# Patient Record
Sex: Male | Born: 1980
Health system: Southern US, Community
[De-identification: ages and names within clinical notes are randomized; demographics above are authoritative.]

## PROBLEM LIST (undated history)

## (undated) DIAGNOSIS — F32A Depression, unspecified: Secondary | ICD-10-CM

## (undated) DIAGNOSIS — G4730 Sleep apnea, unspecified: Secondary | ICD-10-CM

## (undated) DIAGNOSIS — G4700 Insomnia, unspecified: Secondary | ICD-10-CM

## (undated) DIAGNOSIS — K8020 Calculus of gallbladder without cholecystitis without obstruction: Secondary | ICD-10-CM

## (undated) DIAGNOSIS — F329 Major depressive disorder, single episode, unspecified: Secondary | ICD-10-CM

## (undated) DIAGNOSIS — B009 Herpesviral infection, unspecified: Secondary | ICD-10-CM

## (undated) DIAGNOSIS — F419 Anxiety disorder, unspecified: Secondary | ICD-10-CM

## (undated) HISTORY — DX: Herpesviral infection, unspecified: B00.9

## (undated) HISTORY — DX: Insomnia, unspecified: G47.00

## (undated) HISTORY — DX: Sleep apnea, unspecified: G47.30

## (undated) HISTORY — DX: Anxiety disorder, unspecified: F41.9

## (undated) HISTORY — DX: Major depressive disorder, single episode, unspecified: F32.9

## (undated) HISTORY — DX: Depression, unspecified: F32.A

---
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
## 2008-08-29 ENCOUNTER — Emergency Department (HOSPITAL_COMMUNITY): Admission: EM | Admit: 2008-08-29 | Discharge: 2008-08-29 | Payer: Self-pay | Admitting: Family Medicine

## 2011-09-30 HISTORY — PX: BONE MARROW HARVEST: SHX896

## 2014-10-18 ENCOUNTER — Ambulatory Visit (INDEPENDENT_AMBULATORY_CARE_PROVIDER_SITE_OTHER): Payer: 59 | Admitting: Family Medicine

## 2014-10-18 ENCOUNTER — Encounter: Payer: Self-pay | Admitting: Family Medicine

## 2014-10-18 VITALS — BP 136/90 | HR 68 | Temp 98.6°F | Ht 68.0 in | Wt 237.4 lb

## 2014-10-18 DIAGNOSIS — Z13220 Encounter for screening for lipoid disorders: Secondary | ICD-10-CM

## 2014-10-18 DIAGNOSIS — B009 Herpesviral infection, unspecified: Secondary | ICD-10-CM

## 2014-10-18 DIAGNOSIS — G4700 Insomnia, unspecified: Secondary | ICD-10-CM | POA: Insufficient documentation

## 2014-10-18 DIAGNOSIS — J309 Allergic rhinitis, unspecified: Secondary | ICD-10-CM | POA: Insufficient documentation

## 2014-10-18 DIAGNOSIS — G4719 Other hypersomnia: Secondary | ICD-10-CM

## 2014-10-18 DIAGNOSIS — N529 Male erectile dysfunction, unspecified: Secondary | ICD-10-CM | POA: Insufficient documentation

## 2014-10-18 DIAGNOSIS — A6000 Herpesviral infection of urogenital system, unspecified: Secondary | ICD-10-CM | POA: Insufficient documentation

## 2014-10-18 DIAGNOSIS — F329 Major depressive disorder, single episode, unspecified: Secondary | ICD-10-CM

## 2014-10-18 DIAGNOSIS — G4726 Circadian rhythm sleep disorder, shift work type: Secondary | ICD-10-CM | POA: Insufficient documentation

## 2014-10-18 DIAGNOSIS — F418 Other specified anxiety disorders: Secondary | ICD-10-CM | POA: Insufficient documentation

## 2014-10-18 DIAGNOSIS — F32A Depression, unspecified: Secondary | ICD-10-CM

## 2014-10-18 DIAGNOSIS — R030 Elevated blood-pressure reading, without diagnosis of hypertension: Secondary | ICD-10-CM | POA: Insufficient documentation

## 2014-10-18 LAB — TSH: TSH: 1.102 u[IU]/mL (ref 0.350–4.500)

## 2014-10-18 LAB — BASIC METABOLIC PANEL
BUN: 13 mg/dL (ref 6–23)
CALCIUM: 9.4 mg/dL (ref 8.4–10.5)
CO2: 25 mEq/L (ref 19–32)
CREATININE: 1.03 mg/dL (ref 0.50–1.35)
Chloride: 106 mEq/L (ref 96–112)
Glucose, Bld: 96 mg/dL (ref 70–99)
Potassium: 4.2 mEq/L (ref 3.5–5.3)
SODIUM: 140 meq/L (ref 135–145)

## 2014-10-18 LAB — LIPID PANEL
CHOL/HDL RATIO: 4 ratio
CHOLESTEROL: 156 mg/dL (ref 0–200)
HDL: 39 mg/dL — ABNORMAL LOW (ref >39)
LDL Cholesterol: 96 mg/dL (ref 0–99)
Triglycerides: 105 mg/dL (ref <150)
VLDL: 21 mg/dL (ref 0–40)

## 2014-10-18 MED ORDER — VALACYCLOVIR HCL 1 G PO TABS: 1000.0000 mg | ORAL_TABLET | Freq: Every day | ORAL | Status: DC

## 2014-10-18 MED ORDER — FLUOXETINE HCL 20 MG PO TABS: 20.0000 mg | ORAL_TABLET | Freq: Every day | ORAL | Status: DC

## 2014-10-18 MED ORDER — BUPROPION HCL ER (XL) 300 MG PO TB24: 300.0000 mg | ORAL_TABLET | Freq: Every day | ORAL | Status: DC

## 2014-10-18 MED ORDER — TRAZODONE HCL 100 MG PO TABS
100.0000 mg | ORAL_TABLET | Freq: Every evening | ORAL | Status: DC | PRN
Start: 2014-10-18 — End: 2015-10-24

## 2014-10-18 NOTE — Assessment & Plan Note (Signed)
Chronic known insomnia, likely multifactorial etiology due to chronic anxiety / mood disorder, poor sleep hygiene, and currently 3rd shift work. Possible OSA - Previously improved on anti-depressants and Trazodone PRN  Plan: 1. Refilled Trazodone 100mg  qhs PRN insomnia 2. Refilled Buproprion-XL and Fluoxetine 3. Work-up for OSA with sleep study

## 2014-10-18 NOTE — Patient Instructions (Signed)
Dear Q, Thank you for coming in to clinic today. It was good to meet you!  Today ordered your medications, and labs (chemistry, cholesterol, TSH), also ordered sleep study. You will be called with this appointment.  Some important numbers from today's visit: BP - 137/90 Results -  Please schedule a follow-up appointment with Dr. Althea CharonKaramalegos for follow-up results and sleep study in 1-3 months.  If you have any other questions or concerns, please feel free to call the clinic to contact me. You may also schedule an earlier appointment if necessary.  However, if your symptoms get significantly worse, please go to the Emergency Department to seek immediate medical attention.  Saralyn PilarAlexander Karamalegos, DO [facility]

## 2014-10-18 NOTE — Assessment & Plan Note (Signed)
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 Refilled Valacyclovir 1g daily suppression

## 2014-10-18 NOTE — Assessment & Plan Note (Signed)
Elevated BP, borderline HTN. No known h/o HTN  Plan: 1. Re-check BP at next follow-up 2. Check BMET

## 2014-10-18 NOTE — Progress Notes (Signed)
Subjective:    Patient ID: Jesse Shelton, male    DOB: [birth year], 34 y.o.   MRN: [id number]  Patient presents to establish as new patient to [facility]. Previously followed by physician at [facility], last seen < 1 year ago, and currently out of all rx medications for past few months.  HPI  ANXIETY / DEPRESSION / H/o ADHD: - Chronic history of anxiety with mood disorder diagnosed in 2007 by primary care doctor, referred to therapist for counseling in 2008-2009 with improvement, started on anti-depressant medications with Buproprion (also for smoking cessation), added Fluoxetine, overall with improved mood, admitted side-effect of decreased sexual drive and given Viagra - Currently has run out of all psych meds (Buprioprion, Fluoxetine, Trazodone) for past 3-4 months, last seen by prior doctor about 1 year ago, and now following up today to get re-established and renew rx's. Admits anxiety seems to be worse off of medications, otherwise admits to stable mood. Some increased difficulty sleeping (see insomnia) - Admits family hx mood disorder (Grandmother, Aunt) - PHQ-2: 0 (negative) - Admits fatigue / sleepiness - Denies suicidal or homicidal ideation, feelings of sadness or guilt, decreased energy, palpitations, chest pain, SOB    SLEEPINESS / FATIGUE / INSOMNIA: - Chronic h/o insomnia thought to be related to anxiety / mood disorder dx for past 6-7 years - Currently working 3rd shift ([employer]), which has worsened his insomnia, previously better when on normal sleep schedule, now takes multiple consecutive "naps". Admits to difficulty staying asleep but able to initially fall asleep. Previously improved on Trazodone PRN (not every night) - Additionally, complains of increased sleepiness (unclear duration) with some fatigue, but mostly feels "very tired" sleep is not always restful, occasionally falls asleep easily during day, but denies falling asleep during work, while driving, or during  any significant stimulating activities. - Admits bed partner describes episodes of snoring and "stopped breathing"  GENITAL HERPES: - Diagnosed in 2009, initially treated with Acyclovir, eventually switched to Valacyclovir daily for suppression on dose 1g daily. Infrequent outbreaks, currently improved. Had been getting rx from [facility] and [facility] (when no longer followed at 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 - Lives at home in [place] with 2 children (Daughter-6 yr, Son-4 yr), currently in relationship with children's Mother but she lives in separate location  - Active smoker mostly e-cigs 2x daily >10 years (not heavy, mostly for stress-reducing), rarely smokes actual cigs - black & mild (1x monthly) - Drinks alcohol, 1x weekly (1-2 cans beer, 1 liquor drink, usually weekends)  Review of Systems  See above HPI    Objective:   Physical Exam  BP 136/90 mmHg  Pulse 68  Temp(Src) 98.6 F (37 C) (Oral)  Ht  (1.727 m)  Wt 237 lb 6.4 oz (107.684 kg)  BMI 36.10 kg/m2  Gen - well-appearing, NAD HEENT - NCAT, PERRL, patent nares w/o congestion, oropharynx clear, Mallampati II-III, MMM Neck - supple, non-tender, no LAD, no thyromegaly Heart - RRR, no murmurs heard Lungs - CTAB. Normal work of breathing. Abd - soft, NTND. Scar s/p ventral hernia repair (midline above umbilicus) Ext - non-tender, no edema, peripheral pulses intact +2 b/l dp Skin - warm, dry, no  rashes Neuro - awake, alert, grossly non-focal Psych - well-groomed, congruent mood, normal speech / thought content, good insight     Assessment & Plan:   See specific A&P  problem list for details.

## 2014-10-18 NOTE — Assessment & Plan Note (Signed)
Chronic hx since 2007, currently stable without recent flare despite off medications x 3-4 months, some worsening anxiety, associated insomnia / fatigue - Previously followed by therapist 2008-2009 - PHQ-2 (0, negative)  Plan: 1. Resumed Buproprion-XL 300mg  daily 2. Resumed Fluoxetine 20mg  daily 3. Refilled Trazodone PRN 4. RTC 1 month follow-up, consider future referral for therapy if needed

## 2014-10-18 NOTE — Assessment & Plan Note (Addendum)
History concerning for possible OSA (cannot rule out) with excessive daytime sleepiness and reported partner witnessed apnea events + snoring, exam supportive with neck appearing larger for body habitus and Mallampati II-III. Otherwise, etiology is likely multifactorial with differential including 3rd shift work, chronic insomnia likely due to anxiety/mood disorder, poor sleep hyigene - No red flag daytime sleeping events impairing function or causing injury  Plan: 1. Ordered nocturnal polysomnography (to [facility]), eval for possible OSA 2. Check BMET, TSH (alternative etiology of fatigue/sleepiness) 3. RTC following sleep study, review results, if negative re-emphasize sleep hygiene and improving modifiable factors

## 2014-10-24 ENCOUNTER — Encounter: Payer: Self-pay | Admitting: Family Medicine

## 2014-11-30 ENCOUNTER — Ambulatory Visit (HOSPITAL_BASED_OUTPATIENT_CLINIC_OR_DEPARTMENT_OTHER): Payer: 59 | Attending: Family Medicine | Admitting: Radiology

## 2014-11-30 ENCOUNTER — Ambulatory Visit (INDEPENDENT_AMBULATORY_CARE_PROVIDER_SITE_OTHER): Payer: 59 | Admitting: Pulmonary Disease

## 2014-11-30 ENCOUNTER — Encounter: Payer: Self-pay | Admitting: Pulmonary Disease

## 2014-11-30 VITALS — Ht 69.0 in | Wt 230.0 lb

## 2014-11-30 VITALS — BP 148/100 | HR 103 | Temp 98.0°F | Ht 70.0 in | Wt 231.0 lb

## 2014-11-30 DIAGNOSIS — R0683 Snoring: Secondary | ICD-10-CM

## 2014-11-30 DIAGNOSIS — G4733 Obstructive sleep apnea (adult) (pediatric): Secondary | ICD-10-CM | POA: Diagnosis not present

## 2014-11-30 DIAGNOSIS — G4763 Sleep related bruxism: Secondary | ICD-10-CM | POA: Diagnosis not present

## 2014-11-30 DIAGNOSIS — G4719 Other hypersomnia: Secondary | ICD-10-CM

## 2014-11-30 NOTE — Progress Notes (Signed)
Chief Complaint  Patient presents with  . Advice Only    Pt reports snoring, frequent awakening, and was told that he stops breathing while sleeping.     History of Present Illness: Jesse Shelton is a 34 y.o. male for evaluation of sleep problems.  His family has been concerned about his breathing while he is asleep.  He will snore and stop breathing while asleep.  This happens more on his back.  He will wake up hearing himself snore.  This has been getting worse.  He will also sometimes talk in his sleep.  He works as a Water quality scientist at [employer].  He works evening shift on Friday, Saturday, Sunday.  He usually goes to sleep at 7 am, and will sleep for about 4 to 6 hours.  He will get his kids from school and spend the afternoon with them.  He will then go back to sleep at night and sleep for another 6 hours.  In spite of sleeping this much he is tired and sleepy all the time.  He can fall asleep easily when sitting quiet.  He has trazodone, but only uses this when he doesn't have to watch his kids >> otherwise he sleeps too hard when he takes trazodone.  He will use energy drinks to stay awake when he is at work.  He denies sleep walking, bruxism, or nightmares.  There is no history of restless legs.  He denies sleep hallucinations, sleep paralysis, or cataplexy.  The Epworth score is 14 out of 24.  Jesse Shelton  has a past medical history of Anxiety; Depression; and Insomnia.  Jesse Shelton  has past surgical history that includes Hernia repair (N/A, 2002) and Elbow surgery (1996).  Prior to Admission medications   Medication Sig Start Date End Date Taking? Authorizing Provider  buPROPion (WELLBUTRIN XL) 300 MG 24 hr tablet Take 1 tablet (300 mg total) by mouth daily. 10/18/14  Yes Alexander Althea Charon, DO  FLUoxetine (PROZAC) 20 MG tablet Take 1 tablet (20 mg total) by mouth daily. 10/18/14  Yes Saralyn Pilar, DO  sildenafil (VIAGRA) 50 MG tablet Take by mouth. 11/19/12   Yes Historical Provider, MD  traZODone (DESYREL) 100 MG tablet Take 1 tablet (100 mg total) by mouth at bedtime as needed for sleep. 10/18/14  Yes Alexander Althea Charon, DO  valACYclovir (VALTREX) 1000 MG tablet Take 1 tablet (1,000 mg total) by mouth daily. 10/18/14  Yes Saralyn Pilar, DO    Allergies  Allergen Reactions  . Penicillins Other (See Comments)    Unknown reaction as child    His family history includes Cancer in his maternal grandmother; Depression in his maternal aunt and maternal grandmother.  He  reports that he has been smoking Cigarettes.  He has a .5 pack-year smoking history. He has never used smokeless tobacco. He reports that he drinks about 1.2 oz of alcohol per week. He reports that he does not use illicit drugs.   Physical Exam: Blood pressure 148/100, pulse 103, temperature 98 F (36.7 C), temperature source Oral, height  (1.778 m), weight 231 lb (104.781 kg), SpO2 96 %. Body mass index is 33.15 kg/(m^2).  General - No distress ENT - No sinus tenderness, no oral exudate, no LAN, no thyromegaly, TM clear, pupils equal/reactive, MP 3, 2+ tonsils, elongated uvula Cardiac - s1s2 regular, no murmur, pulses symmetric Chest - No wheeze/rales/dullness, good air entry, normal respiratory excursion Back - No focal tenderness Abd - Soft, non-tender, no organomegaly, + bowel  sounds Ext - No edema Neuro - Normal strength, cranial nerves intact Skin - No rashes Psych - Normal mood, and behavior  Discussion: He has snoring, sleep disruption, witnessed apnea, and daytime sleepiness.  He has history of depression and elevated blood pressure.  I am concerned he could have sleep apnea.  We discussed how sleep apnea can affect various health problems including risks for hypertension, cardiovascular disease, and diabetes.  We also discussed how sleep disruption can increase risks for accident, such as while driving.  Weight loss as a means of improving sleep apnea  was also reviewed.  Additional treatment options discussed were CPAP therapy, oral appliance, and surgical intervention.  Assessment/plan:  Snoring. Plan: - he has in lab sleep study scheduled for April 2016 >> will see if this can scheduled sooner  Shift work syndrome. Plan: - reviewed sleep hygiene - advised to try consolidating his sleep - will re-address this after review of his sleep study   Coralyn HellingVineet Quintus Premo, M.D. Pager [phone]

## 2014-11-30 NOTE — Patient Instructions (Signed)
Will see if we can schedule your sleep study to be done sooner than April Will call to schedule follow up after sleep study reviewed

## 2014-12-03 DIAGNOSIS — G4719 Other hypersomnia: Secondary | ICD-10-CM

## 2014-12-03 NOTE — Sleep Study (Signed)
   NAME: Charlott HollerQuentin D Walczyk DATE OF BIRTH:  1980-11-09 MEDICAL RECORD NUMBER [id number]  LOCATION: [facility]  PHYSICIAN: Jody Aguinaga D  DATE OF STUDY: 11/30/2014  SLEEP STUDY TYPE: Nocturnal Polysomnogram               REFERRING PHYSICIAN: Barbaraann BarthelBreen, James O, MD  INDICATION FOR STUDY: Hypersomnia with sleep apnea  EPWORTH SLEEPINESS SCORE:   10/24 HEIGHT: 5\' 9"  (175.3 cm)  WEIGHT: 104.327 kg (230 lb)    Body mass index is 33.95 kg/(m^2).  NECK SIZE: 17.5 in.  MEDICATIONS: Charted for review  SLEEP ARCHITECTURE: Total sleep time 290 minutes with sleep efficiency 72.5%. Stage I was 20.3%, stage II 51.4%, stage III 0.2%, REM 28.1% of total sleep time. Sleep latency 18.5 minutes, REM latency 197 minutes, awake after sleep onset 91.5 minutes, arousal index 28.1, bedtime medication: None  RESPIRATORY DATA: Apnea hypopnea index (AHI) 19.9 per hour. 96 total events scored, all as hypopneas, non-positional. REM AHI 0.7 per hour. This study was ordered as a diagnostic polysomnogram protocol without CPAP.  OXYGEN DATA: Moderate snoring with oxygen desaturation to a nadir of 84% and mean saturation 95.9% on room air  CARDIAC DATA: Sinus rhythm with occasional PVC  MOVEMENT/PARASOMNIA: No significant limb movement disturbance, bathroom 1. Frequent bruxism noted  IMPRESSION/ RECOMMENDATION:   1) Moderate obstructive sleep apnea/hypopnea syndrome, AHI 19.9 per hour with non-positional events. REM AHI 0.7 per hour. Moderate snoring with oxygen desaturation to a nadir of 84% and mean saturation 95.9% on room air. 2) This study was ordered as a diagnostic polysomnogram protocol without CPAP titration. This patient can return for dedicated CPAP titration study if appropriate. 3) Frequent bruxism was noted 4) Sleep pattern was marked by frequent brief awakenings throughout the study. Not all of this could be related to his respiratory pattern. The record indicates that he works a  third shift job which may expose him to shift worker sleep disorder related to disruption of circadian rhythm.   Waymon BudgeYOUNG,Bailyn Spackman D Diplomate, American Board of Sleep Medicine  ELECTRONICALLY SIGNED ON:  12/03/2014, 4:27 PM [facility] PH: [phone]   FX: [phone] ACCREDITED BY THE AMERICAN ACADEMY OF SLEEP MEDICINE

## 2014-12-31 ENCOUNTER — Encounter: Payer: Self-pay | Admitting: Pulmonary Disease

## 2014-12-31 DIAGNOSIS — G4733 Obstructive sleep apnea (adult) (pediatric): Secondary | ICD-10-CM

## 2015-01-01 ENCOUNTER — Encounter (HOSPITAL_BASED_OUTPATIENT_CLINIC_OR_DEPARTMENT_OTHER): Payer: 59

## 2015-01-01 NOTE — Telephone Encounter (Signed)
Message     I wanted to see if you received the results from the sleep study. My girlfriend has notice my sleep is becoming worst. Thank you.     Sleep study results are in the computer but no interpretation yet from [facility] Email response sent to patient notifying him we will check on this and to see how his girlfriend feels that his sleep is worsening Will forward to [facility] about the results/recs -- Dr. Craige CottaSood please advise, thank you.

## 2015-01-01 NOTE — Telephone Encounter (Signed)
His sleep study from 11/30/14 was read by Dr. Maple HudsonYoung on 12/03/14.  This showed moderate OSA with and AHI of 19.9 and SaO2 low of 84%.  He also had frequent episodes of bruxism.  I believe his results were routed to Dr. Paula ComptonJames Breen who was listed as ordering physician.  Options at this time are 1) arrange for auto CPAP now and ROV after, or 2) have ROV first >> this can be either with me or Tammy Parrett.  If he is agreeable to start CPAP, then please send order for auto CPAP with range 5 to 15 cm H2O with heated humidity and mask of choice.  Please have download sent 1 month after starting CPAP and set up ROV 2 months after starting CPAP.

## 2015-01-03 NOTE — Telephone Encounter (Signed)
Spoke with pt, aware of results.  [facility] for 2 months with [facility] Nothing further needed.

## 2015-01-03 NOTE — Telephone Encounter (Signed)
[phone] pt calling back

## 2015-02-14 ENCOUNTER — Encounter: Payer: Self-pay | Admitting: Pulmonary Disease

## 2015-02-14 DIAGNOSIS — G4733 Obstructive sleep apnea (adult) (pediatric): Secondary | ICD-10-CM | POA: Insufficient documentation

## 2015-04-03 ENCOUNTER — Ambulatory Visit: Payer: 59 | Admitting: Pulmonary Disease

## 2015-04-05 ENCOUNTER — Ambulatory Visit (INDEPENDENT_AMBULATORY_CARE_PROVIDER_SITE_OTHER): Payer: 59 | Admitting: Pulmonary Disease

## 2015-04-05 ENCOUNTER — Encounter: Payer: Self-pay | Admitting: Pulmonary Disease

## 2015-04-05 VITALS — BP 128/82 | HR 81 | Ht 69.0 in | Wt 238.4 lb

## 2015-04-05 DIAGNOSIS — Z9989 Dependence on other enabling machines and devices: Principal | ICD-10-CM

## 2015-04-05 DIAGNOSIS — G4733 Obstructive sleep apnea (adult) (pediatric): Secondary | ICD-10-CM

## 2015-04-05 NOTE — Progress Notes (Signed)
Chief Complaint  Patient presents with  . Follow-up    CPAP compliance > Pt c/o nasal dryness. Current mask is nasal pillows. Denies issues with pressure setting.    History of Present Illness: Jesse Shelton is a 34 y.o. male with OSA.  He has been using CPAP.  This has helped.  He tried nasal mask >> was uncomfortable.  He does better with nasal pillow mask.  He gets occasional air leak, but not too often.  He feels more rested during the day.  He only needs to sleep 5 to 6 hours per night now.  TESTS: PSG 11/30/14 >> AHI 19.9, SaO2 low 84% Auto CPAP 03/04/15 to 04/02/15 >> used on 29 of 30 nights with average 4 hrs and 59 min.  Average AHI is 1.5 with median CPAP 7 cm H2O and 95 th percentile CPAP 10 cm H20.  Past medical hx >> Anxiety, Depression  Past surgical hx, Medications, Allergies, Family hx, Social hx all reviewed.   Physical Exam: BP 128/82 mmHg  Pulse 81  Ht 5\' 9"  (1.753 m)  Wt 238 lb 6.4 oz (108.138 kg)  BMI 35.19 kg/m2  SpO2 98%  General - No distress ENT - No sinus tenderness, no oral exudate, no LAN, MP 2 Cardiac - s1s2 regular, no murmur Chest - No wheeze/rales/dullness Back - No focal tenderness Abd - Soft, non-tender Ext - No edema Neuro - Normal strength Skin - No rashes Psych - normal mood, and behavior   Assessment/Plan:  Obstructive sleep apnea. He is compliant with CPAP and reports benefit from therapy. Plan: - continue auto CPAP   Coralyn HellingVineet Kennis Wissmann, MD [facility] Pager:  [phone]

## 2015-04-05 NOTE — Patient Instructions (Signed)
Follow up in 1 year.

## 2015-10-24 ENCOUNTER — Encounter: Payer: Self-pay | Admitting: Family Medicine

## 2015-10-24 ENCOUNTER — Ambulatory Visit (INDEPENDENT_AMBULATORY_CARE_PROVIDER_SITE_OTHER): Payer: 59 | Admitting: Family Medicine

## 2015-10-24 VITALS — BP 128/86 | HR 74 | Temp 98.3°F | Ht 69.0 in | Wt 248.7 lb

## 2015-10-24 DIAGNOSIS — IMO0002 Reserved for concepts with insufficient information to code with codable children: Secondary | ICD-10-CM

## 2015-10-24 DIAGNOSIS — N528 Other male erectile dysfunction: Secondary | ICD-10-CM

## 2015-10-24 DIAGNOSIS — G4733 Obstructive sleep apnea (adult) (pediatric): Secondary | ICD-10-CM | POA: Diagnosis not present

## 2015-10-24 DIAGNOSIS — F32A Depression, unspecified: Secondary | ICD-10-CM

## 2015-10-24 DIAGNOSIS — F418 Other specified anxiety disorders: Secondary | ICD-10-CM | POA: Diagnosis not present

## 2015-10-24 DIAGNOSIS — Z6830 Body mass index (BMI) 30.0-30.9, adult: Secondary | ICD-10-CM | POA: Diagnosis not present

## 2015-10-24 DIAGNOSIS — E668 Other obesity: Secondary | ICD-10-CM | POA: Diagnosis not present

## 2015-10-24 DIAGNOSIS — F329 Major depressive disorder, single episode, unspecified: Secondary | ICD-10-CM

## 2015-10-24 DIAGNOSIS — M25561 Pain in right knee: Secondary | ICD-10-CM | POA: Insufficient documentation

## 2015-10-24 DIAGNOSIS — R030 Elevated blood-pressure reading, without diagnosis of hypertension: Secondary | ICD-10-CM | POA: Diagnosis not present

## 2015-10-24 DIAGNOSIS — G4726 Circadian rhythm sleep disorder, shift work type: Secondary | ICD-10-CM

## 2015-10-24 DIAGNOSIS — N529 Male erectile dysfunction, unspecified: Secondary | ICD-10-CM

## 2015-10-24 DIAGNOSIS — B009 Herpesviral infection, unspecified: Secondary | ICD-10-CM

## 2015-10-24 DIAGNOSIS — R7303 Prediabetes: Secondary | ICD-10-CM

## 2015-10-24 DIAGNOSIS — IMO0001 Reserved for inherently not codable concepts without codable children: Secondary | ICD-10-CM | POA: Insufficient documentation

## 2015-10-24 LAB — LIPID PANEL
CHOL/HDL RATIO: 4.7 ratio (ref <=5.0)
Cholesterol: 169 mg/dL (ref 125–200)
HDL: 36 mg/dL — ABNORMAL LOW (ref >=40)
LDL Cholesterol: 104 mg/dL (ref <130)
TRIGLYCERIDES: 147 mg/dL (ref <150)
VLDL: 29 mg/dL (ref <30)

## 2015-10-24 LAB — COMPLETE METABOLIC PANEL WITH GFR
ALT: 46 U/L (ref 9–46)
AST: 22 U/L (ref 10–40)
Albumin: 3.8 g/dL (ref 3.6–5.1)
Alkaline Phosphatase: 83 U/L (ref 40–115)
BUN: 11 mg/dL (ref 7–25)
CHLORIDE: 106 mmol/L (ref 98–110)
CO2: 27 mmol/L (ref 20–31)
CREATININE: 1.09 mg/dL (ref 0.60–1.35)
Calcium: 9.1 mg/dL (ref 8.6–10.3)
GFR, Est African American: >89 mL/min (ref >=60)
GFR, Est Non African American: 89 mL/min (ref >=60)
Glucose, Bld: 113 mg/dL — ABNORMAL HIGH (ref 65–99)
POTASSIUM: 4.3 mmol/L (ref 3.5–5.3)
Sodium: 140 mmol/L (ref 135–146)
Total Bilirubin: 0.4 mg/dL (ref 0.2–1.2)
Total Protein: 7.3 g/dL (ref 6.1–8.1)

## 2015-10-24 LAB — POCT GLYCOSYLATED HEMOGLOBIN (HGB A1C): Hemoglobin A1C: 6.3

## 2015-10-24 MED ORDER — SILDENAFIL CITRATE 50 MG PO TABS: 50.0000 mg | ORAL_TABLET | ORAL | Status: DC | PRN

## 2015-10-24 MED ORDER — BUPROPION HCL ER (XL) 300 MG PO TB24: 300.0000 mg | ORAL_TABLET | Freq: Every day | ORAL | Status: DC

## 2015-10-24 MED ORDER — VALACYCLOVIR HCL 1 G PO TABS: 1000.0000 mg | ORAL_TABLET | Freq: Every day | ORAL | Status: DC

## 2015-10-24 MED ORDER — FLUOXETINE HCL 20 MG PO TABS: 20.0000 mg | ORAL_TABLET | Freq: Every day | ORAL | Status: DC

## 2015-10-24 MED FILL — valACYclovir HCL 1 GM TABS: 1 | 90 days supply | Qty: 90 | Fill #0

## 2015-10-24 MED FILL — VIAGRA 50 MG TABLET: 50 | 30 days supply | Qty: 6 | Fill #0

## 2015-10-24 MED FILL — FLUoxetine HCL 20 MG CAPS: 20 | 90 days supply | Qty: 90 | Fill #0

## 2015-10-24 MED FILL — BUPROPION HCL XL 300 MG TAB: 300 | 90 days supply | Qty: 90 | Fill #0

## 2015-10-24 NOTE — Progress Notes (Signed)
Subjective:    Patient ID: Jesse Shelton, male    DOB: [birth year], 35 y.o.   MRN: [id number]  HPI  RIGHT KNEE PAIN - Chronic problem for past >8 years had a fall with R-knee sprain since recovered, otherwise no significant injuries, no prior surgeries or treatments. 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 Describes intermittent painful episodes, localized to knee cap and lower knee anteriorly, lasting 5-15 min at a time, then pain improves but has some lingering discomfort. Sometimes has stiffness and catching but never complete locking or giving way causing to fall - Denies any swelling, redness, weakness, injury or trauma  ANXIETY / DEPRESSION: - Chronic history of anxiety with mood disorder diagnosed in 2007 by PCP, has been established with therapist for counseling 2008-2009. Treated for several years now with Buproprion and Fluoxetine. Previously had been off meds for few months prior to establishing last year at [facility] in 09/2014, with noticeable worsening anxiety at that time. - Today seems to be doing well overall. PHQ-2: 0. Does admit to significant anxiety compared to depression, which seems to be controlled. Admits low level anxiety related to job-related stress most days. Denies any panic attacks or acute flares. 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 Previously problems of anxiety / depression thought to be contributing to some insomnia / fatigue as well - He reports symptoms improved on CPAP, now he states can't sleep well without CPAP. However he still has problems feeling tired during the day, mostly related to his 3rd shift or night shift job as Water quality scientist for about 1 year with long shifts. Thought trazodone was making him groggy, self discontinued this for >1 month, but still feels tired during day. Does not seem adjusted to night shift, will often nap regularly on days off, does not use CPAP during naps, and regularly falls asleep at home. - Does not drink caffeine. Active smoker with e-cigs but rarely smokes actual cigs  GENITAL HERPES: - Chronic history dx 2009, with infrequent outbreaks, suppressed on Valacyclovir therapy - Requests refill today - Denies active lesions or recent outbreak  Social History  Substance Use Topics  . Smoking status: Current Every Day Smoker -- 0.10 packs/day for 5 years    Types: Cigarettes  . Smokeless tobacco: Never Used     Comment: occassional  . Alcohol Use: 1.2 oz/week    0 Standard drinks or equivalent, 1 Cans of beer, 1 Shots of liquor per week   Review of Systems  See above HPI    Objective:   Physical Exam  Constitutional: He is oriented to person, place, and time. He appears well-developed and well-nourished. No distress.  Comfortable, well-appearing  HENT:  Head: Normocephalic and atraumatic.  Mouth/Throat: Oropharynx is clear and moist.  Eyes: Conjunctivae and EOM are normal. Pupils are equal, round, and reactive to light.  Neck: Normal range of  motion. Neck supple. No thyromegaly present.  Cardiovascular: Normal rate, regular rhythm, normal heart sounds and intact distal pulses.   No murmur heard. Pulmonary/Chest: Effort normal and breath sounds normal. No respiratory distress. He has no wheezes. He has no rales.  Musculoskeletal:  Right Knee Inspection:  Normal appearance and symmetrical. No ecchymosis or effusion. Palpation: Mild +TTP right patella compression and inferior over patellar tendon, mild tenderness over medial joint line. No creptius ROM: Full active ROM bilaterally Special Testing: Lachman / Valgus/Varus tests negative with intact ligaments (ACL, MCL, LCL). McMurray positive medial with some popping and pain Strength: 5/5 intact knee flex/ext, ankle dorsi/plantarflex Neurovascular: distally intact sensation light touch and pulses  Lymphadenopathy:    He has no cervical adenopathy.  Neurological: He is alert and oriented to person, place, and time.  Skin: Skin is warm and dry. No rash noted. He is not diaphoretic.  Psychiatric: He has a normal mood and affect. His behavior is normal. Judgment and thought content normal.  Nursing note and vitals reviewed.   BP 128/86 mmHg  Pulse 74  Temp(Src) 98.3 F (36.8 C) (Oral)  Ht  (1.753 m)  Wt 248 lb 11.2 oz (112.81 kg)  BMI 36.71 kg/m2      Assessment & Plan:   Problem List Items Addressed This Visit    Adult BMI 30+    Weight gain +10 lbs in 1 year, limited exercise and some poor dietary choices  Plan: 1. Screening for DM with A1c 2. Screen for hyperlipidemia with fasting lipid panel 3. Check CMET for Cr routine screening, electrolytes on medications, LFTs  Updated results - A1c 6.3 (no prior comparison) consistent with Pre-DM - Lipids with TG 147, HDL 36, LDL 104, overall concern for worsening metabolic syndrome with near >150 TGs and HDL < 39 - LFTs stable       Relevant Orders   COMPLETE METABOLIC PANEL WITH GFR (Completed)   Lipid panel (Completed)   POCT glycosylated hemoglobin (Hb A1C) (Completed)   ED (erectile dysfunction) of organic origin    Secondary to SSRI - Refilled Viagra rx  tabs, #10, refill, can request more if needed, concern with cost      Relevant Medications   sildenafil (VIAGRA) 50 MG tablet   Elevated BP    Improved on  re-check. Concern with OSA.      Relevant Orders   COMPLETE METABOLIC PANEL WITH GFR (Completed)   Herpes    Controlled. Chronic suppression.  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 Anxiety seems to be primary issue now, still worsens with stress from work, likely multifactorial with 3rd shift sleep disorder and poor sleep at times. Depression seems well controlled.  Plan: 1. Continue Buproprion-XL  daily, Fluoxetine  daily 2. Discontinue Trazodone due to concern oversedation 3. Referral to [facility] to augment with counseling/therapy, also to focus on shift-work sleep disorder and proper sleep hygiene counseling 4. Follow-up as needed      Relevant Medications   FLUoxetine (PROZAC) 20 MG tablet   buPROPion (WELLBUTRIN XL) 300 MG 24 hr tablet   Other Relevant Orders   Ambulatory referral to Psychology   OSA (obstructive sleep apnea)    Improved sleep on CPAP, however still significant difficulties with fatigue / tiredness due to likely shift worker sleep disorder. Also mild elevated BP, considered OSA as possible  factor.  Plan: 1. Continue CPAP, even wear during daily naps. 2. Follow with Pulm as needed      Pre-diabetes    A1c 6.3, no prior for comparison. Counseling on lifestyle modifications, diet / exercise Follow-up repeat A1c 3 to 6 months, consider metformin in future      Right anterior knee pain    Concern for possible patellofemoral pain syndrome (PFPS) in young active patient with patellar pain intermittent episodes and worse with prolonged standing / sitting. Consider patellar tendinitis based on location of symptoms, but not clear trigger for this. Additionally, differential includes small or partial meniscus tear from old injury with some mechanical symptoms.  Plan: 1. Relative rest, ice if swelling, compression  sleeve daily at work or active 2. Handouts given for exercises PFPS / patellar tendinitis, gradually increase 3. Tylenol, NSAIDs PRN 4. Follow-up 1 month re-evaluation, consider future X-rays, PT, [facility] vs [facility] depending on if concern for meniscus      Shift work sleep disorder    Suspect multifactorial sleep problems with shift work sleep disorder after about 1 yr on night shift, does not seem adjusted to this, prior OSA now treated >6 mo on CPAP, also anxiety/mood disorder likely contributing to poor sleep  Plan: 1. Referral to [facility] for counseling / therapy for anxiety / depression and emphasis of managing shift-work sleep disorder 2. Future follow-up continue to emphasize sleep hygiene, minimize naps when on days off, use CPAP during naps if taking. If not improving with therapy, discontinued trazodone, CPAP adherence, consider referral to Sleep Medicine for further evaluation. Additionally advised that may benefit from changing shift to days but he is unable to due to financial benefits of night shift at this time      Relevant Orders   Ambulatory referral to Psychology    Other Visit Diagnoses    Clinical depression        Relevant Medications    FLUoxetine (PROZAC) 20 MG tablet    buPROPion (WELLBUTRIN XL) 300 MG 24 hr tablet      Follow-up Return in about 1 month (around 11/24/2015) for Right knee pain.  Saralyn Pilar, DO [facility], PGY-3

## 2015-10-24 NOTE — Patient Instructions (Signed)
Thank you for coming in to clinic today.  1. For your Anxiety / Depression / Sleep disorder - Refilled medications. For 90 days. DIscontinued Trazodone - Referral to [facility] for counseling / therapy, discuss anxiety, stress, and shift work sleep disorder - Recommend to reduce naps during day and allow yourself to get back on normal sleep pattern sooner, use CPAP if napping - Future can refer to Sleep Specialist if needed  2. Right Knee Pain - This can be combination of things causing chronic intermittent pain. Most likely you have some Patellofemoral Pain Syndrome and maybe some Patellar Tendinitis. However there is a chance you have an old chronic small meniscus tear that flares up - Follow the exercises given for now, gradually increase these over next 1-2 months - Wear sleeve everyday at work and if walking a lot or exercising - Elevate if swollen, may use ice if sore Recommend to start taking Tylenol Extra Strength  tabs - take 1 to 2 tabs (max  per dose) every 6 hours for pain (take regularly, don't skip a dose for next 3-7 days), max 24 hour daily dose is 6 to 8 tablets or 3000 to   Labs today, we will review results at next visit  Please schedule a follow-up appointment with Dr Althea Charon in 1-2 months for Right Knee pain  If you have any other questions or concerns, please feel free to call the clinic to contact me. You may also schedule an earlier appointment if necessary.  However, if your symptoms get significantly worse, please go to the Emergency Department to seek immediate medical attention.  Saralyn Pilar, DO [facility]

## 2015-10-25 DIAGNOSIS — R7303 Prediabetes: Secondary | ICD-10-CM | POA: Insufficient documentation

## 2015-10-25 NOTE — Assessment & Plan Note (Signed)
Improved sleep on CPAP, however still significant difficulties with fatigue / tiredness due to likely shift worker sleep disorder. Also mild elevated BP, considered OSA as possible factor.  Plan: 1. Continue CPAP, even wear during daily naps. 2. Follow with Pulm as needed

## 2015-10-25 NOTE — Assessment & Plan Note (Signed)
Suspect multifactorial sleep problems with shift work sleep disorder after about 1 yr on night shift, does not seem adjusted to this, prior OSA now treated >6 mo on CPAP, also anxiety/mood disorder likely contributing to poor sleep  Plan: 1. Referral to [facility] for counseling / therapy for anxiety / depression and emphasis of managing shift-work sleep disorder 2. Future follow-up continue to emphasize sleep hygiene, minimize naps when on days off, use CPAP during naps if taking. If not improving with therapy, discontinued trazodone, CPAP adherence, consider referral to Sleep Medicine for further evaluation. Additionally advised that may benefit from changing shift to days but he is unable to due to financial benefits of night shift at this time

## 2015-10-25 NOTE — Assessment & Plan Note (Signed)
Controlled. Chronic suppression.  - Refilled Valacyclovir

## 2015-10-25 NOTE — Assessment & Plan Note (Signed)
Stable, chronic. Anxiety seems to be primary issue now, still worsens with stress from work, likely multifactorial with 3rd shift sleep disorder and poor sleep at times. Depression seems well controlled.  Plan: 1. Continue Buproprion-XL  daily, Fluoxetine  daily 2. Discontinue Trazodone due to concern oversedation 3. Referral to [facility] to augment with counseling/therapy, also to focus on shift-work sleep disorder and proper sleep hygiene counseling 4. Follow-up as needed

## 2015-10-25 NOTE — Assessment & Plan Note (Signed)
Improved on re-check. Concern with OSA.

## 2015-10-25 NOTE — Assessment & Plan Note (Signed)
Secondary to SSRI - Refilled Viagra rx  tabs, #10, refill, can request more if needed, concern with cost

## 2015-10-25 NOTE — Assessment & Plan Note (Signed)
A1c 6.3, no prior for comparison. Counseling on lifestyle modifications, diet / exercise Follow-up repeat A1c 3 to 6 months, consider metformin in future

## 2015-10-25 NOTE — Assessment & Plan Note (Signed)
Concern for possible patellofemoral pain syndrome (PFPS) in young active patient with patellar pain intermittent episodes and worse with prolonged standing / sitting. Consider patellar tendinitis based on location of symptoms, but not clear trigger for this. Additionally, differential includes small or partial meniscus tear from old injury with some mechanical symptoms.  Plan: 1. Relative rest, ice if swelling, compression sleeve daily at work or active 2. Handouts given for exercises PFPS / patellar tendinitis, gradually increase 3. Tylenol, NSAIDs PRN 4. Follow-up 1 month re-evaluation, consider future X-rays, PT, [facility] vs [facility] depending on if concern for meniscus

## 2015-10-25 NOTE — Assessment & Plan Note (Signed)
Weight gain +10 lbs in 1 year, limited exercise and some poor dietary choices  Plan: 1. Screening for DM with A1c 2. Screen for hyperlipidemia with fasting lipid panel 3. Check CMET for Cr routine screening, electrolytes on medications, LFTs  Updated results - A1c 6.3 (no prior comparison) consistent with Pre-DM - Lipids with TG 147, HDL 36, LDL 104, overall concern for worsening metabolic syndrome with near >150 TGs and HDL < 39 - LFTs stable

## 2015-11-22 DIAGNOSIS — G4733 Obstructive sleep apnea (adult) (pediatric): Secondary | ICD-10-CM | POA: Diagnosis not present

## 2015-12-20 ENCOUNTER — Telehealth: Payer: Self-pay | Admitting: Family Medicine

## 2015-12-20 DIAGNOSIS — G4726 Circadian rhythm sleep disorder, shift work type: Secondary | ICD-10-CM

## 2015-12-20 DIAGNOSIS — F418 Other specified anxiety disorders: Secondary | ICD-10-CM

## 2015-12-20 NOTE — Telephone Encounter (Signed)
Last exam on 10/24/15, at that time evaluated patient for Mixed Mood Disorder with depression / anxiety as well as shift work sleep disorder contributing to this problem, we had agreed on referral to Psychology for counseling/therapy as a starting point. Referral was placed on 10/24/15, however due to security/access and problem with the order there was a delay in processing this referral.  I spoke with Jesse Shelton regarding the status of this referral, and we ultimately resolved the issue after a new order was placed today on 12/20/15. Jesse Shelton will plan to work on this referral and hope to have a scheduling update by tomorrow Friday 12/21/15.  Called patient, spoke with Jesse Shelton, I informed him of the above issue with the original order, he was very understanding, and I advised that we now will be working on the new referral order, and will update him on progress.  Please call patient with any updated referral / scheduling appointment information either Friday 12/21/15 or next week once this is updated.  Jesse PilarAlexander Kerrie Latour, DO [facility], PGY-3

## 2015-12-20 NOTE — Telephone Encounter (Signed)
At pts last visit, dr suggested a referral to behavorial health for anxiety. Pt hasnt heard anything about the referral.  Please advise

## 2015-12-21 NOTE — Telephone Encounter (Signed)
[facility] yesterday evening. Called their office this morning to make sure the referral was received. It was received, and their office will contact patient to schedule his appt. Called and advised the patient of this, he was very Adult nurseappreciative.

## 2016-01-01 ENCOUNTER — Ambulatory Visit (INDEPENDENT_AMBULATORY_CARE_PROVIDER_SITE_OTHER): Payer: 59 | Admitting: Psychiatry

## 2016-01-01 ENCOUNTER — Encounter (HOSPITAL_COMMUNITY): Payer: Self-pay | Admitting: Psychiatry

## 2016-01-01 VITALS — BP 140/88 | HR 103 | Ht 69.0 in | Wt 253.6 lb

## 2016-01-01 DIAGNOSIS — F329 Major depressive disorder, single episode, unspecified: Secondary | ICD-10-CM | POA: Insufficient documentation

## 2016-01-01 DIAGNOSIS — F330 Major depressive disorder, recurrent, mild: Secondary | ICD-10-CM | POA: Diagnosis not present

## 2016-01-01 DIAGNOSIS — F32A Depression, unspecified: Secondary | ICD-10-CM | POA: Insufficient documentation

## 2016-01-01 MED ORDER — LAMOTRIGINE 25 MG PO TABS: ORAL_TABLET | ORAL | Status: DC

## 2016-01-01 MED FILL — lamoTRIgine 25 MG TABS: 25 | 30 days supply | Qty: 60 | Fill #0

## 2016-01-01 NOTE — Progress Notes (Addendum)
[facility] Initial Assessment Note  Jesse Shelton [id number] 35 y.o.  01/01/2016 11:22 AM  Chief Complaint:  I have a lot of stress and anxiety.  I'm feeling overwhelmed.  My medicine make me zombie.  History of Present Illness:  Patient is 35 year old African-American, employed, newly married man who was referred from his primary care physician Dr. Nobie Putnam for the management of his depression.  Patient has long history of depression and has been taking Prozac 20 mg and Wellbutrin XL 300 mg for 10 years which is prescribed by psychiatrist and recently renewed by his primary care physician.  Patient does not feel his medicine is working as good and he also feels it is making them zombie.  Patient has multiple stressors including stressful job as a phlebotomy, going through a custody battle, significant financial distress and having family issues.  Patient told his ex-girlfriend moved to [facility] and he also decided to moved from her home to [place] so children can be closer to their mother but he has noticed that children's mother is difficult to work with.  Patient has 20-year-old son and 33-year-old daughter .  Patient also admitted having struggle with his marital relationship.  He is newly married in February and his wife now wants to However he had a vasectomy and he does not want anymore kids.  Patient admitted these stressors causing him more irritable, frustrated, depressed, angry and depressed.  He admitted poor sleep, worried about his future, mood swing and having racing thoughts.  Though he denies any suicidal thoughts or homicidal thought but admitted decreased energy, fatigue, anhedonia, sometime feeling hopelessness and worthlessness.  He's been taking his psychiatric medication but admitted noncompliant for past few weeks because he does feel zombie when he takes them.  Though it helps his depression but he feels medicine is taking his personal emotions  and feeling that he does not feel anything.  He like to try a different medication.  Patient also not seeing any therapist but open to see a counselor .  Patient denies any paranoia, hallucination, psychosis, mania, OCD symptoms, PTSD symptoms nightmares, flashback, panic attack or any aggressive behavior.  He denies any history of suicidal attempt.  He has no tremors or shakes.  Patient denies drinking or using any illegal substances.  He do not recall any significant changes in his appetite are weight.  He admitted being very emotional and sensitive at work.  He endorse his coworker does not do their job on time and he feels overwhelmed.  Patient described his personality is a type A personality he is open to try a different medication.  Suicidal Ideation: No Plan Formed: No Patient has means to carry out plan: No  Homicidal Ideation: No Plan Formed: No Patient has means to carry out plan: No  Past Psychiatric History/Hospitalization(s): Patient endorse history of depression 9 years ago when he was living in [place].  He saw Dr. Lazarus Salines and he was given Prozac and Wellbutrin.  He he tried also trazodone but it make him very sleepy.  Patient denies any history of mania, psychosis, hallucination, suicidal attempt or any aggressive behavior.  He denies any history of sexual, verbal or physical abuse.  He never had any psychological testing .   Anxiety: Yes Bipolar Disorder: No Depression: Yes Mania: No Psychosis: No Schizophrenia: No Personality Disorder: No Hospitalization for psychiatric illness: No History of Electroconvulsive Shock Therapy: No Prior Suicide Attempts: No  Family History; Patient endorsed multiple family member has depression.  Medical History; Patient has erectile dysfunction.  He see primary care physician at [facility].  Patient denies any history of seizures .    Traumatic brain injury: Patient denies any history of traumatic brain injury.  Education and  Work History; Patient has high school graduation.  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  Psychosocial History; Patient was born in [place].  He lived there most of his life.  He was raised by his mother and his grandmother.  His parents never married.  Patient told his parents were cousins to each other and he never had any close relationship his father.  Patient has 91-year-old and 48-year-old from his previous relationship which was ended 2 years ago.  Patient is trying to get custody of his kids.  He recently married in February 2017.  Legal History; Patient denies any legal issues.  History Of Abuse; Patient denies any history of abuse.  Substance Abuse History; Patient denies any history of illegal substance use.  Review of Systems: Psychiatric: Agitation: No Hallucination: No Depressed Mood: Yes Insomnia: Yes Hypersomnia: No Altered Concentration: No Feels Worthless: No Grandiose Ideas: No Belief In Special Powers: No New/Increased Substance Abuse: No Compulsions: No  Neurologic: Headache: No Seizure: No Paresthesias: No   Outpatient Encounter Prescriptions as of 01/01/2016  Medication Sig  . buPROPion (WELLBUTRIN XL) 300 MG 24 hr tablet Take 1 tablet (300 mg total) by mouth daily.  Marland Kitchen lamoTRIgine (LAMICTAL) 25 MG tablet Take 1 tab daily for 1 week and than 2 tab daily  . sildenafil (VIAGRA) 50 MG tablet Take 1 tablet (50 mg total) by mouth as needed for erectile dysfunction. Reported on 10/24/2015  . valACYclovir (VALTREX) 1000 MG tablet Take 1 tablet (1,000 mg total) by mouth daily.  . [DISCONTINUED] FLUoxetine (PROZAC) 20 MG tablet Take 1 tablet (20 mg total) by mouth daily.   No facility-administered encounter medications on file as of 01/01/2016.    Recent Results (from the past 2160 hour(s))  COMPLETE METABOLIC PANEL WITH GFR     Status: Abnormal   Collection Time: 10/24/15  9:20 AM  Result Value Ref Range   Sodium 140 135 - 146  mmol/L   Potassium 4.3 3.5 - 5.3 mmol/L   Chloride 106 98 - 110 mmol/L   CO2 27 20 - 31 mmol/L   Glucose, Bld 113 (H) 65 - 99 mg/dL   BUN 11 7 - 25 mg/dL   Creat 1.09 0.60 - 1.35 mg/dL   Total Bilirubin 0.4 0.2 - 1.2 mg/dL   Alkaline Phosphatase 83 40 - 115 U/L   AST 22 10 - 40 U/L   ALT 46 9 - 46 U/L   Total Protein 7.3 6.1 - 8.1 g/dL   Albumin 3.8 3.6 - 5.1 g/dL   Calcium 9.1 8.6 - 10.3 mg/dL   GFR, Est African American >89 >=60 mL/min   GFR, Est Non African American 89 >=60 mL/min    Comment:   The estimated GFR is a calculation valid for adults (>=91 years old) that uses the CKD-EPI algorithm to adjust for age and sex. It is   not to be used for children, pregnant women, hospitalized patients,    patients on dialysis, or with rapidly changing kidney function. According to the NKDEP, eGFR >89 is normal, 60-89 shows mild impairment, 30-59 shows moderate impairment, 15-29 shows severe impairment and <15 is ESRD.     Lipid panel     Status: Abnormal  Collection Time: 10/24/15  9:20 AM  Result Value Ref Range   Cholesterol 169 125 - 200 mg/dL   Triglycerides 147 <150 mg/dL   HDL 36 (L) >=40 mg/dL   Total CHOL/HDL Ratio 4.7 <=5.0 Ratio   VLDL 29 <30 mg/dL   LDL Cholesterol 104 <130 mg/dL    Comment:   Total Cholesterol/HDL Ratio:CHD Risk                        Coronary Heart Disease Risk Table                                        Men       Women          1/2 Average Risk              3.4        3.3              Average Risk              5.0        4.4           2X Average Risk              9.6        7.1           3X Average Risk             23.4       11.0 Use the calculated Patient Ratio above and the CHD Risk table  to determine the patient's CHD Risk.   POCT glycosylated hemoglobin (Hb A1C)     Status: None   Collection Time: 10/24/15  9:20 AM  Result Value Ref Range   Hemoglobin A1C 6.3       Constitutional:  BP 140/88 mmHg  Pulse 103  Ht 5' 9" (1.753 m)   Wt 253 lb 9.6 oz (115.032 kg)  BMI 37.43 kg/m2   Musculoskeletal: Strength & Muscle Tone: within normal limits Gait & Station: normal Patient leans: N/A  Psychiatric Specialty Exam: General Appearance: Casual  Eye Contact::  Fair  Speech:  Slow  Volume:  Decreased  Mood:  Anxious and Depressed  Affect:  Appropriate  Thought Process:  Coherent  Orientation:  Full (Time, Place, and Person)  Thought Content:  Rumination  Suicidal Thoughts:  No  Homicidal Thoughts:  No  Memory:  Immediate;   Fair Recent;   Fair Remote;   Fair  Judgement:  Fair  Insight:  Fair  Psychomotor Activity:  Decreased  Concentration:  Fair  Recall:  [employer] of Knowledge:  Fair  Language:  Fair  Akathisia:  No  Handed:  Right  AIMS (if indicated):     Assets:  Communication Skills Desire for Improvement Housing Physical Health  ADL's:  Intact  Cognition:  WNL  Sleep:        New problem, with additional work up planned, Review of Psycho-Social Stressors (1), Review or order clinical lab tests (1), Decision to obtain old records (1), Review and summation of old records (2), Established Problem, Worsening (2), Review of Medication Regimen & Side Effects (2) and Review of New Medication or Change in Dosage (2)  Assessment: Axis I: Major depressive disorder, recurrent mild  Axis II: Deferred  Axis III:  Past Medical History  Diagnosis Date  .  Anxiety   . Depression   . Insomnia   . Sleep apnea      Plan:  I review his symptoms, history, current medication, psychosocial stressors and his blood work.  Currently he is taking Prozac 20 mg daily and Wellbutrin XL 300 mg daily however he is poorly compliant with medication due to side effects including feeling like zombie and sexual side effects.  I encouraged to continue Wellbutrin at present dose however I will discontinue Prozac and a start Lamictal 25 mg daily for 1 week and then 50 mg daily.  Discussed medication side effects especially  rash with Lamictal and in that case he needed to stop the medication immediately.  I do believe patient require counseling and therapy.  We will schedule appointment with therapist in this office for coping and social skills.  Discuss safety plan that anytime having active suicidal thoughts or homicidal thoughts and he need to call 911 or go to local emergency room.  Follow-up in 3 weeks.  Time spent 55 minutes.  More than 50% of the time spent in psychoeducation, counseling and coordination of care.  ARFEEN,SYED T., MD 01/01/2016

## 2016-01-17 ENCOUNTER — Ambulatory Visit (INDEPENDENT_AMBULATORY_CARE_PROVIDER_SITE_OTHER): Payer: 59 | Admitting: Family Medicine

## 2016-01-17 ENCOUNTER — Encounter: Payer: Self-pay | Admitting: Family Medicine

## 2016-01-17 VITALS — BP 126/73 | HR 80 | Temp 98.3°F | Ht 69.0 in | Wt 253.0 lb

## 2016-01-17 DIAGNOSIS — R030 Elevated blood-pressure reading, without diagnosis of hypertension: Secondary | ICD-10-CM | POA: Diagnosis not present

## 2016-01-17 DIAGNOSIS — E8881 Metabolic syndrome: Secondary | ICD-10-CM

## 2016-01-17 DIAGNOSIS — R7303 Prediabetes: Secondary | ICD-10-CM | POA: Diagnosis not present

## 2016-01-17 DIAGNOSIS — IMO0001 Reserved for inherently not codable concepts without codable children: Secondary | ICD-10-CM

## 2016-01-17 LAB — POCT GLYCOSYLATED HEMOGLOBIN (HGB A1C): Hemoglobin A1C: 6.4

## 2016-01-17 NOTE — Assessment & Plan Note (Signed)
Consistent with Pre-DM, elevated BP, larger waist obesity, low HDL. - Counseled on lifestyle, wt loss, referral nutrition

## 2016-01-17 NOTE — Patient Instructions (Signed)
Thank you for coming in to clinic today.  1. Your history is consistent with Pre-Diabetes, A1c essentially stable from 6.3 to 6.4, not consistent with Diabetes (technically, which is above 6.5, but this will gradually increase in time, so do not be alarmed if this happens) - Referral to Nutrition - Diabetic Education center, stay tuned for apt, if you don't hear back call them to see - [facility] ? [place] Address: [address], [place], [place] [address] Phone: [phone]  My 5 to Fitness!  5: fruits and vegetables per day (work on 9 per day if you are at 5) 4: exercise 4-5 times per week for at least 30 minutes (walking counts!) 3: meals per day (don't skip breakfast!), no more than 5 hours between meals 2: habits to quit -smoking -excess alcohol use (men >2 beer/day; women >1beer/day) 1: sweet per day (2 cookies, 1 small cup of ice cream, 12 oz soda)  Diet Recommendations for Diabetes   Starchy (carb) foods include: Bread, rice, pasta, potatoes, corn, crackers, bagels, muffins, all baked goods.   Protein foods include: Meat, fish, poultry, eggs, dairy foods, and beans such as pinto and kidney beans (beans also provide carbohydrate).   1. Eat at least 3 meals and 1-2 snacks per day. Never go more than 4-5 hours while awake without eating.   2. Limit starchy foods to TWO per meal and ONE per snack. ONE portion of a starchy  food is equal to the following:   - ONE slice of bread (or its equivalent, such as half of a hamburger bun).   - 1/2 cup of a "scoopable" starchy food such as potatoes or rice.   - 1 OUNCE (28 grams) of starchy snacks (crackers or pretzels, look on label).   - 15 grams of carbohydrate as shown on food label.   3. Both lunch and dinner should include a protein food, a carb food, and vegetables.   - Obtain twice as many veg's as protein or carbohydrate foods for both lunch and dinner.    - Try to keep frozen veg's on hand for a quick vegetable serving.     - Fresh or frozen veg's are best.   4. Breakfast should always include protein.     Please schedule a follow-up appointment with Dr Althea CharonKaramalegos in 3 to 6 months for Pre-Diabetes follow-up, re-check A1c, future discuss metformin medications  If you have any other questions or concerns, please feel free to call the clinic to contact me. You may also schedule an earlier appointment if necessary.  However, if your symptoms get significantly worse, please go to the Emergency Department to seek immediate medical attention.  Saralyn PilarAlexander Makynli Stills, DO [facility]

## 2016-01-17 NOTE — Assessment & Plan Note (Signed)
Stable, unchanged A1c 6.3 to 6.4. Consistent with Pre-DM on cusp of new dx DM2, anticipate will continue to rise in future. - Did not adhere to lifestyle changes after initial dx  Plan: 1. Referral to DM Education Nutrition 2. Now plans to start DM diet, regular exercise given confirmed persistent elevated A1c - will defer Metformin start again after discussing with patient, however anticipate may need to start this within 6-12 mo if still elevated A1c 3. Future consider low dose ACEi (also if BP rises again) 4. RTC within 6 mo - A1c

## 2016-01-17 NOTE — Assessment & Plan Note (Signed)
Normal today manual cuff reading. Patient opted to not have electronic reading. Consistent with metabolic syndrome with Pre-DM, obesity, elevated BP, low HDL  Plan: 1. Monitor BP, lifestyle changes diet/exercise 2. Future may start ACEi

## 2016-01-17 NOTE — Progress Notes (Signed)
Subjective:    Patient ID: Jesse Shelton, male    DOB: 01-18-1981, 35 y.o.   MRN: [id number]  HPI  Pre-Diabetes / OBESITY: Reports - did not change diet since last visit wanted to see how sugar would be. CBGs: Does not check sugars at this time. Meds: None - not interested in Metformin last time discussed elevated A1c Not on ACE/ARB. Lifestyle: He is moving in with newlywed wife (since February) in May, anticipates this will greatly help with meal planning (rather than cooking for himself) also new location has more room for walking outdoors and will walk both dogs Admits weight gain (weight up +5 lbs in 3 months) Denies hypoglycemia, polyuria, visual changes, numbness or tingling.  H/o Elevated BP Reports does not check BP outside office. Today he requested manual BP check initially. Last visit 09/2015 he had initial elevated BP electronic cuff then improved manually. No prior dx HTN. Current Meds - none Denies CP, dyspnea, HA, edema, dizziness / lightheadedness   Social History  Substance Use Topics  . Smoking status: Current Every Day Smoker -- 0.10 packs/day for 5 years    Types: Cigarettes  . Smokeless tobacco: Never Used     Comment: occassional  . Alcohol Use: 1.2 oz/week    1 Cans of beer, 1 Shots of liquor, 0 Standard drinks or equivalent per week   Review of Systems  See above HPI    Objective:   Physical Exam  Constitutional: He appears well-developed and well-nourished. No distress.  Well-appearing, comfortable, cooperative  HENT:  Mouth/Throat: Oropharynx is clear and moist.  Cardiovascular: Normal rate, regular rhythm, normal heart sounds and intact distal pulses.   No murmur heard. Pulmonary/Chest: Effort normal and breath sounds normal. No respiratory distress. He has no wheezes. He has no rales.  Neurological: He is alert.  Skin: Skin is warm and dry. He is not diaphoretic.  Psychiatric: He has a normal mood and affect. His behavior is normal.    Nursing note and vitals reviewed.   BP 126/73 mmHg  Pulse 80  Temp(Src) 98.3 F (36.8 C) (Oral)  Ht  (1.753 m)  Wt 253 lb (114.76 kg)  BMI 37.34 kg/m2      Assessment & Plan:   Problem List Items Addressed This Visit    Pre-diabetes - Primary    Stable, unchanged A1c 6.3 to 6.4. Consistent with Pre-DM on cusp of new dx DM2, anticipate will continue to rise in future. - Did not adhere to lifestyle changes after initial dx  Plan: 1. Referral to DM Education Nutrition 2. Now plans to start DM diet, regular exercise given confirmed persistent elevated A1c - will defer Metformin start again after discussing with patient, however anticipate may need to start this within 6-12 mo if still elevated A1c 3. Future consider low dose ACEi (also if BP rises again) 4. RTC within 6 mo - A1c      Relevant Orders   POCT HgB A1C (CPT 83036) (Completed)   Ambulatory referral to diabetic education   Metabolic syndrome    Consistent with Pre-DM, elevated BP, larger waist obesity, low HDL. - Counseled on lifestyle, wt loss, referral nutrition      Elevated BP    Normal today manual cuff reading. Patient opted to not have electronic reading. Consistent with metabolic syndrome with Pre-DM, obesity, elevated BP, low HDL  Plan: 1. Monitor BP, lifestyle changes diet/exercise 2. Future may start ACEi         Follow-up  Return in about 6 months (around 07/18/2016) for pre diabetes.  Saralyn PilarAlexander Gwin Eagon, DO [facility], PGY-3

## 2016-01-29 ENCOUNTER — Ambulatory Visit (INDEPENDENT_AMBULATORY_CARE_PROVIDER_SITE_OTHER): Payer: 59 | Admitting: Psychiatry

## 2016-01-29 ENCOUNTER — Encounter (HOSPITAL_COMMUNITY): Payer: Self-pay | Admitting: Psychiatry

## 2016-01-29 VITALS — BP 140/86 | HR 94 | Ht 69.0 in | Wt 252.8 lb

## 2016-01-29 DIAGNOSIS — F330 Major depressive disorder, recurrent, mild: Secondary | ICD-10-CM | POA: Diagnosis not present

## 2016-01-29 MED ORDER — LAMOTRIGINE 100 MG PO TABS
100.0000 mg | ORAL_TABLET | Freq: Every day | ORAL | Status: DC
Start: 2016-01-29 — End: 2016-03-25

## 2016-01-29 MED FILL — lamoTRIgine 100 MG TABS: 100 | 30 days supply | Qty: 30 | Fill #0

## 2016-01-29 NOTE — Progress Notes (Signed)
[facility] [id number] Progress Note  Charlott HollerQuentin D Shelton [id number] 35 y.o.  01/29/2016 11:14 AM  Chief Complaint:  I have been very busy.  I am moving into a new place.  I like the medication.    History of Present Illness:  Jesse MandesQuentin is a 35 year old African-American unemployed man who was seen 4 weeks ago 40 June evaluation .  Patient came for his follow-up appointment.  We started him on Lamictal .  He is taking 50 mg and reported no side effects.  He has no rash itching or any headaches.  He does not feel zombie which he had experienced with the Prozac.  However he is been very busy in past few days.  He is moving to a new place and last night his dog ran away.  He continues to struggle with the court dates .  He is time to get custody of his 2 children.  Patient told Court  mandated counseling and mediation.  He will start counseling with Jesse Shelton on 11.  He sleeping better.  Though he gets easily tired and exhausted.  He continues to have a lot of rumination about his past and feels sometimes hopeless worthless and easily fatigued.  He liked Lamictal because it is not making him zombie.  He is also taking Wellbutrin.  He has no tremors, shakes rash.  Patient denies drinking or using any illegal substances.  His appetite is okay.  His energy level is fair.  He wants to continue Wellbutrin and wondering if Lamictal dose can be further increase.  Suicidal Ideation: No Plan Formed: No Patient has means to carry out plan: No  Homicidal Ideation: No Plan Formed: No Patient has means to carry out plan: No  Past Psychiatric History/Hospitalization(s): Patient has history of depression 9 years ago when he was living in [place].  He saw Jesse Shelton and he was given Prozac and Wellbutrin.  He he tried also trazodone but it make him very sleepy.  Patient denies any history of mania, psychosis, hallucination, suicidal attempt or any aggressive behavior.  He denies any history of sexual, verbal or  physical abuse.  He never had any psychological testing .   Anxiety: Yes Bipolar Disorder: No Depression: Yes Mania: No Psychosis: No Schizophrenia: No Personality Disorder: No Hospitalization for psychiatric illness: No History of Electroconvulsive Shock Therapy: No Prior Suicide Attempts: No  Family History; Patient endorsed multiple family member has depression.  Medical History; Patient has erectile dysfunction.  He [facility].  Patient denies any history of seizures .    Education and Work History; Patient has high school graduation.  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  Psychosocial History; Patient was born in [place].  He lived there most of his life.  He was raised by his mother and his grandmother.  His parents never married.  Patient told his parents were cousins to each other and he never had any close relationship his father.  Patient has 33-year-old and 33-year-old from his previous relationship which was ended 2 years ago.  Patient is trying to get custody of his kids.  He recently married in February 2017.  Review of Systems: Psychiatric: Agitation: No Hallucination: No Depressed Mood: Yes Insomnia: No Hypersomnia: No Altered Concentration: No Feels Worthless: No Grandiose Ideas: No Belief In Special Powers: No New/Increased Substance Abuse: No Compulsions: No  Neurologic: Headache: No Seizure: No Paresthesias: No   Outpatient Encounter Prescriptions  as of 01/29/2016  Medication Sig  . buPROPion (WELLBUTRIN XL) 300 MG 24 hr tablet Take 1 tablet (300 mg total) by mouth daily.  Marland Kitchen lamoTRIgine (LAMICTAL) 100 MG tablet Take 1 tablet (100 mg total) by mouth daily.  . sildenafil (VIAGRA) 50 MG tablet Take 1 tablet (50 mg total) by mouth as needed for erectile dysfunction. Reported on 10/24/2015  . valACYclovir (VALTREX) 1000 MG tablet Take 1 tablet (1,000 mg total) by mouth daily.  .  [DISCONTINUED] lamoTRIgine (LAMICTAL) 25 MG tablet Take 1 tab daily for 1 week and than 2 tab daily   No facility-administered encounter medications on file as of 01/29/2016.    Recent Results (from the past 2160 hour(s))  POCT HgB A1C (CPT 83036)     Status: None   Collection Time: 01/17/16  1:45 PM  Result Value Ref Range   Hemoglobin A1C 6.4       Constitutional:  BP 140/86 mmHg  Pulse 94  Ht  (1.753 m)  Wt 252 lb 12.8 oz (114.669 kg)  BMI 37.31 kg/m2   Musculoskeletal: Strength & Muscle Tone: within normal limits Gait & Station: normal Patient leans: N/A  Psychiatric Specialty Exam: General Appearance: Casual  Eye Contact::  Fair  Speech:  Slow  Volume:  Decreased  Mood:  Depressed  Affect:  Appropriate  Thought Process:  Coherent  Orientation:  Full (Time, Place, and Person)  Thought Content:  Rumination  Suicidal Thoughts:  No  Homicidal Thoughts:  No  Memory:  Immediate;   Fair Recent;   Fair Remote;   Fair  Judgement:  Fair  Insight:  Fair  Psychomotor Activity:  Decreased  Concentration:  Fair  Recall:  [employer] of Knowledge:  Fair  Language:  Fair  Akathisia:  No  Handed:  Right  AIMS (if indicated):     Assets:  Communication Skills Desire for Improvement Housing Physical Health  ADL's:  Intact  Cognition:  WNL  Sleep:        Review of Psycho-Social Stressors (1), Review or order clinical lab tests (1), Established Problem, Worsening (2), Review of Last Therapy Session (1), Review of Medication Regimen & Side Effects (2) and Review of New Medication or Change in Dosage (2)  Assessment: Axis I: Major depressive disorder, recurrent mild  Axis II: Deferred  Axis III:  Past Medical History  Diagnosis Date  . Anxiety   . Depression   . Insomnia   . Sleep apnea      Plan:  Patient shown some improvement from Lamictal.  He has no side effects.  He is no longer taking Prozac.  I recommended to increase Lamictal 100 mg daily.   Reminded him about side effects including rash and itching.  He will continue Wellbutrin XL 300 mg daily which is prescribed by his primary care physician.  He has appointment with Dicky Doe. Discuss safety plan that anytime having active suicidal thoughts or homicidal thoughts and he need to call 911 or go to local emergency room.  Follow-up in 3 weeks.  Time spent 25 minutes.  More than 50% of the time spent in psychoeducation, counseling and coordination of care.  Lylia Karn T., MD 01/29/2016

## 2016-02-07 ENCOUNTER — Ambulatory Visit (HOSPITAL_COMMUNITY): Payer: Self-pay | Admitting: Psychology

## 2016-02-12 ENCOUNTER — Ambulatory Visit (INDEPENDENT_AMBULATORY_CARE_PROVIDER_SITE_OTHER): Payer: 59 | Admitting: Licensed Clinical Social Worker

## 2016-02-12 DIAGNOSIS — F319 Bipolar disorder, unspecified: Secondary | ICD-10-CM

## 2016-02-15 ENCOUNTER — Ambulatory Visit: Payer: Self-pay | Admitting: Dietician

## 2016-02-20 ENCOUNTER — Ambulatory Visit (INDEPENDENT_AMBULATORY_CARE_PROVIDER_SITE_OTHER): Payer: 59 | Admitting: Licensed Clinical Social Worker

## 2016-02-20 DIAGNOSIS — F319 Bipolar disorder, unspecified: Secondary | ICD-10-CM

## 2016-02-21 MED FILL — valACYclovir HCL 1 GM TABS: 1 | 90 days supply | Qty: 90 | Fill #1

## 2016-02-21 MED FILL — lamoTRIgine 100 MG TABS: 100 | 30 days supply | Qty: 30 | Fill #1

## 2016-02-21 MED FILL — BUPROPION HCL XL 300 MG TAB: 300 | 90 days supply | Qty: 90 | Fill #1

## 2016-02-29 ENCOUNTER — Ambulatory Visit (INDEPENDENT_AMBULATORY_CARE_PROVIDER_SITE_OTHER): Payer: 59 | Admitting: Licensed Clinical Social Worker

## 2016-02-29 DIAGNOSIS — F331 Major depressive disorder, recurrent, moderate: Secondary | ICD-10-CM | POA: Diagnosis not present

## 2016-03-05 ENCOUNTER — Ambulatory Visit: Payer: Self-pay | Admitting: Licensed Clinical Social Worker

## 2016-03-10 ENCOUNTER — Encounter: Payer: 59 | Attending: Family Medicine | Admitting: Dietician

## 2016-03-10 ENCOUNTER — Encounter: Payer: Self-pay | Admitting: Dietician

## 2016-03-10 VITALS — Ht 69.0 in | Wt 252.8 lb

## 2016-03-10 DIAGNOSIS — R7303 Prediabetes: Secondary | ICD-10-CM

## 2016-03-10 DIAGNOSIS — Z713 Dietary counseling and surveillance: Secondary | ICD-10-CM | POA: Diagnosis not present

## 2016-03-10 NOTE — Patient Instructions (Addendum)
Goal weight: 210-220 lbs  -Continue to eat frequent meals and snacks (about every 3-5 hours that you are awake) -Work on Writerdeveloping structure with snacks, especially at work! -Ambulance personractice meal prepping before work  -Oncologistortion out leftovers and bring to work   -Start paying attention to carb portions -Eat protein and vegetables first   -Try using My Fitness Pal to log foods  -Work on increasing physical activity!  -Find something that you enjoy!  -Walking the dog  -Aim to get closer to 8 hours of sleep  -Develop a sleep-wake routine as much as possible

## 2016-03-10 NOTE — Progress Notes (Signed)
  Medical Nutrition Therapy:  Appt start time: 1000 end time:  1100.   Assessment:  Primary concerns today: Jesse Shelton states that he is here today for elevated HgbA1c (6.4%). He would like to discuss better food choices overall. He lives with his wife and kids; his wife has had weight loss surgery. His wife does the grocery shopping and cooking. The family usually eats out 1-2x a week. 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 He states that he weighed 30 pounds less than his current weight when he started his job about 2 years ago.   Preferred Learning Style:   No preference indicated   Learning Readiness:   Ready   MEDICATIONS: see list   DIETARY INTAKE:  24-hr recall (work days - 3rd shift):  2-3 PM: leftovers  5 PM: salad with vegetables, bacon bits, ham, cheese, and ranch dressing  may snack on candy or crackers 12 AM: foot long [employer] cold cut combo 6 AM: sausage and cheese biscuit from [employer]   Beverages: water, ginger ale  24-hr recall (days off):  B (11 AM-1 PM): egg with breakfast meat  Snk (2:30 PM): cheeseburger from fast food place  D (8 PM): chicken spaghetti or other pasta dish or pizza Snk ( PM): none  Beverages: water, juice with liquor, tea  Usual physical activity: active during work shifts, sometimes walks with kids  Estimated energy needs: 2000-2200 calories 200-225 g carbohydrates 150-170 g protein 56-61 g fat  Progress Towards Goal(s):  In progress.   Nutritional Diagnosis:  [place]-2.2 Altered nutrition-related laboratory As related to excessive carbohydrate intake, erratic meal pattern, physical inactivity, and obesity.  As evidenced by HgbA1c 6.4%.    Intervention:  Nutrition counseling provided. Identified carbohydrate foods and demonstrated appropriate portion sizes. Discussed carbohydrate counting and recommended approximately 3 carb choices per meal and 1-2 choices at  snacks. Recommended small, frequent meals and snacks and increased physical activity.  Goals: -Continue to eat frequent meals and snacks (about every 3-5 hours that you are awake) -Work on Writerdeveloping structure with snacks, especially at work! -Ambulance personractice meal prepping before work  -Oncologistortion out leftovers and bring to work  -Start paying attention to carb portions -Eat protein and vegetables first  -Try using My Fitness Pal to log foods -Work on increasing physical activity!  -Find something that you enjoy!  -Walking the dog -Aim to get closer to 8 hours of sleep  -Develop a sleep-wake routine as much as possible  Teaching Method Utilized:  Visual Auditory Hands on  Handouts given during visit include:  Meal planning card  MyPlate  Barriers to learning/adherence to lifestyle change: work schedule  Demonstrated degree of understanding via:  Teach Back   Monitoring/Evaluation:  Dietary intake, exercise, labs, and body weight prn.

## 2016-03-24 ENCOUNTER — Ambulatory Visit (INDEPENDENT_AMBULATORY_CARE_PROVIDER_SITE_OTHER): Payer: 59 | Admitting: Licensed Clinical Social Worker

## 2016-03-24 DIAGNOSIS — F331 Major depressive disorder, recurrent, moderate: Secondary | ICD-10-CM | POA: Diagnosis not present

## 2016-03-25 ENCOUNTER — Encounter (HOSPITAL_COMMUNITY): Payer: Self-pay | Admitting: Psychiatry

## 2016-03-25 ENCOUNTER — Ambulatory Visit (INDEPENDENT_AMBULATORY_CARE_PROVIDER_SITE_OTHER): Payer: 59 | Admitting: Psychiatry

## 2016-03-25 VITALS — BP 120/78 | HR 87 | Ht 69.0 in | Wt 251.4 lb

## 2016-03-25 DIAGNOSIS — F32A Depression, unspecified: Secondary | ICD-10-CM

## 2016-03-25 DIAGNOSIS — F330 Major depressive disorder, recurrent, mild: Secondary | ICD-10-CM | POA: Diagnosis not present

## 2016-03-25 DIAGNOSIS — F329 Major depressive disorder, single episode, unspecified: Secondary | ICD-10-CM | POA: Diagnosis not present

## 2016-03-25 MED ORDER — LAMOTRIGINE 150 MG PO TABS: 150.0000 mg | ORAL_TABLET | Freq: Every day | ORAL | Status: DC

## 2016-03-25 MED FILL — lamoTRIgine 150 MG TABS: 150 | 30 days supply | Qty: 30 | Fill #0

## 2016-03-25 NOTE — Progress Notes (Signed)
[facility] [id number] Progress Note  Charlott HollerQuentin D Shelton [id number] 35 y.o.  03/25/2016 9:08 AM  Chief Complaint:  I have a lot of stress at work.     History of Present Illness:  Jesse MandesQuentin came for his follow-up appointment.  He is complaining of increased stress at work.  Recently he was given letter for job accommodation which helps him very much.  He is not working at task and he was given floor because he was very anxious working with a Radio broadcast assistantcoworker.  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  He admitted stress level sometime is so bad that he is actively looking for a new job.  He likes Lamictal which is helping his depression.  He is more active and social but he is not at work.  He is relieved that finally he got settlement after mediation and able to get joint custody for his 2 children.  Patient is seeing Judithe ModestSusan Bond for counseling.  He is also taking Wellbutrin which is prescribed by his Remicade physician.  He sleeping better.  He denies any irritability, anger, severe mood swings.  He's taking Lamictal 100 mg.  He has no rash or itching.  He admitted seeing medical counselor because he endorsed some issues in his marriage but counseling is working very well.  Patient recently married .  Patient denies drinking or using any illegal substances.  His appetite is okay.  His vital signs are stable.  He denies any paranoia, hallucination or any suicidal thoughts.  Suicidal Ideation: No Plan Formed: No Patient has means to carry out plan: No  Homicidal Ideation: No Plan Formed: No Patient has means to carry out plan: No  Past Psychiatric History/Hospitalization(s): Patient has history of depression 9 years ago when he was living in [place].  He saw Dr. Rolley SimsHampton and he was given Prozac and Wellbutrin.  He he tried also trazodone but it make him very sleepy.  Patient denies any history of mania, psychosis, hallucination, suicidal attempt or any aggressive behavior.  He  denies any history of sexual, verbal or physical abuse.  He never had any psychological testing .   Anxiety: Yes Bipolar Disorder: No Depression: Yes Mania: No Psychosis: No Schizophrenia: No Personality Disorder: No Hospitalization for psychiatric illness: No History of Electroconvulsive Shock Therapy: No Prior Suicide Attempts: No  Family History; Patient reported  multiple family member has depression.  Medical History; Patient has erectile dysfunction.  [facility].  Patient denies any history of seizures .    Review of Systems: Psychiatric: Agitation: No Hallucination: No Depressed Mood: No Insomnia: No Hypersomnia: No Altered Concentration: No Feels Worthless: No Grandiose Ideas: No Belief In Special Powers: No New/Increased Substance Abuse: No Compulsions: No  Neurologic: Headache: No Seizure: No Paresthesias: No   Outpatient Encounter Prescriptions as of 03/25/2016  Medication Sig  . buPROPion (WELLBUTRIN XL) 300 MG 24 hr tablet Take 1 tablet (300 mg total) by mouth daily.  Marland Kitchen. lamoTRIgine (LAMICTAL) 150 MG tablet Take 1 tablet (150 mg total) by mouth daily.  . sildenafil (VIAGRA) 50 MG tablet Take 1 tablet (50 mg total) by mouth as needed for erectile dysfunction. Reported on 10/24/2015  . valACYclovir (VALTREX) 1000 MG tablet Take 1 tablet (1,000 mg total) by mouth daily.  . [DISCONTINUED] lamoTRIgine (LAMICTAL) 100 MG tablet Take 1 tablet (100 mg total) by mouth daily.   No facility-administered encounter medications on file as of 03/25/2016.  Recent Results (from the past 2160 hour(s))  POCT HgB A1C (CPT 83036)     Status: None   Collection Time: 01/17/16  1:45 PM  Result Value Ref Range   Hemoglobin A1C 6.4       Constitutional:  BP 120/78 mmHg  Pulse 87  Ht 5\' 9"  (1.753 m)  Wt 251 lb 6.4 oz (114.034 kg)  BMI 37.11 kg/m2   Musculoskeletal: Strength & Muscle Tone: within normal limits Gait &  Station: normal Patient leans: N/A  Psychiatric Specialty Exam: General Appearance: Casual  Eye Contact::  Fair  Speech:  Slow  Volume:  Decreased  Mood:  Depressed  Affect:  Appropriate  Thought Process:  Coherent  Orientation:  Full (Time, Place, and Person)  Thought Content:  Rumination  Suicidal Thoughts:  No  Homicidal Thoughts:  No  Memory:  Immediate;   Fair Recent;   Fair Remote;   Fair  Judgement:  Fair  Insight:  Fair  Psychomotor Activity:  Decreased  Concentration:  Fair  Recall:  FiservFair  Fund of Knowledge:  Fair  Language:  Fair  Akathisia:  No  Handed:  Right  AIMS (if indicated):     Assets:  Communication Skills Desire for Improvement Housing Physical Health  ADL's:  Intact  Cognition:  WNL  Sleep:        Review of Psycho-Social Stressors (1), Review or order clinical lab tests (1), Review of Last Therapy Session (1), Review of Medication Regimen & Side Effects (2) and Review of New Medication or Change in Dosage (2)  Assessment: Axis I: Major depressive disorder, recurrent mild  Axis II: Deferred  Axis III:  Past Medical History  Diagnosis Date  . Anxiety   . Depression   . Insomnia   . Sleep apnea      Plan:  Patient Doing better on his current medication.  He has no side effects.  I reviewed records from his other providers.  His hemoglobin A1c is 6.4 and recently seen nervous to help weight control.  He likes Wellbutrin and Lamictal.  I would increase Lamictal 150 mg daily.  Reminded him about side effects but she rash in that case she needed to stop the medication immediately.  Recommended to continue counseling with Darl PikesSusan bound and marriage counseling with Berniece AndreasJulie Whitt.  He is getting Wellbutrin XL 300 mg from his primary care physician. Discuss safety plan that anytime having active suicidal thoughts or homicidal thoughts and he need to call 911 or go to local emergency room.  Follow-up in 3 months.  Time spent 25 minutes.  More than 50% of  the time spent in psychoeducation, counseling and coordination of care.  Tresha Muzio T., MD 03/25/2016

## 2016-04-02 ENCOUNTER — Ambulatory Visit (INDEPENDENT_AMBULATORY_CARE_PROVIDER_SITE_OTHER): Payer: 59 | Admitting: Licensed Clinical Social Worker

## 2016-04-02 DIAGNOSIS — F319 Bipolar disorder, unspecified: Secondary | ICD-10-CM

## 2016-04-09 ENCOUNTER — Ambulatory Visit (INDEPENDENT_AMBULATORY_CARE_PROVIDER_SITE_OTHER): Payer: 59 | Admitting: Licensed Clinical Social Worker

## 2016-04-09 DIAGNOSIS — F3341 Major depressive disorder, recurrent, in partial remission: Secondary | ICD-10-CM

## 2016-04-16 ENCOUNTER — Ambulatory Visit (INDEPENDENT_AMBULATORY_CARE_PROVIDER_SITE_OTHER): Payer: 59 | Admitting: Licensed Clinical Social Worker

## 2016-04-16 DIAGNOSIS — F319 Bipolar disorder, unspecified: Secondary | ICD-10-CM | POA: Diagnosis not present

## 2016-04-30 ENCOUNTER — Ambulatory Visit: Payer: 59 | Admitting: Licensed Clinical Social Worker

## 2016-05-06 ENCOUNTER — Ambulatory Visit (INDEPENDENT_AMBULATORY_CARE_PROVIDER_SITE_OTHER): Payer: 59 | Admitting: Licensed Clinical Social Worker

## 2016-05-06 DIAGNOSIS — F3341 Major depressive disorder, recurrent, in partial remission: Secondary | ICD-10-CM | POA: Diagnosis not present

## 2016-05-19 ENCOUNTER — Ambulatory Visit: Payer: Self-pay | Admitting: Pulmonary Disease

## 2016-05-20 ENCOUNTER — Ambulatory Visit (INDEPENDENT_AMBULATORY_CARE_PROVIDER_SITE_OTHER): Payer: 59 | Admitting: Psychology

## 2016-05-20 DIAGNOSIS — F411 Generalized anxiety disorder: Secondary | ICD-10-CM | POA: Diagnosis not present

## 2016-05-26 ENCOUNTER — Ambulatory Visit: Payer: 59 | Admitting: Licensed Clinical Social Worker

## 2016-05-30 MED FILL — lamoTRIgine 150 MG TABS: 150 | 30 days supply | Qty: 30 | Fill #1

## 2016-06-04 ENCOUNTER — Ambulatory Visit (INDEPENDENT_AMBULATORY_CARE_PROVIDER_SITE_OTHER): Payer: 59 | Admitting: Psychology

## 2016-06-04 DIAGNOSIS — F3341 Major depressive disorder, recurrent, in partial remission: Secondary | ICD-10-CM

## 2016-06-06 ENCOUNTER — Ambulatory Visit (INDEPENDENT_AMBULATORY_CARE_PROVIDER_SITE_OTHER): Payer: 59 | Admitting: Licensed Clinical Social Worker

## 2016-06-06 DIAGNOSIS — F3341 Major depressive disorder, recurrent, in partial remission: Secondary | ICD-10-CM

## 2016-06-20 ENCOUNTER — Ambulatory Visit (INDEPENDENT_AMBULATORY_CARE_PROVIDER_SITE_OTHER): Payer: 59 | Admitting: Psychology

## 2016-06-20 DIAGNOSIS — F319 Bipolar disorder, unspecified: Secondary | ICD-10-CM

## 2016-06-23 MED FILL — lamoTRIgine 150 MG TABS: 150 | 30 days supply | Qty: 30 | Fill #2

## 2016-06-23 MED FILL — valACYclovir HCL 1 GM TABS: 1 | 90 days supply | Qty: 90 | Fill #2

## 2016-06-23 MED FILL — BUPROPION HCL XL 300 MG TAB: 300 | 90 days supply | Qty: 90 | Fill #2

## 2016-06-25 ENCOUNTER — Ambulatory Visit (HOSPITAL_COMMUNITY): Payer: Self-pay | Admitting: Psychiatry

## 2016-06-26 DIAGNOSIS — H5213 Myopia, bilateral: Secondary | ICD-10-CM | POA: Diagnosis not present

## 2016-07-07 ENCOUNTER — Ambulatory Visit: Payer: 59 | Admitting: Licensed Clinical Social Worker

## 2016-07-09 ENCOUNTER — Ambulatory Visit (INDEPENDENT_AMBULATORY_CARE_PROVIDER_SITE_OTHER): Payer: 59 | Admitting: Psychology

## 2016-07-09 DIAGNOSIS — F319 Bipolar disorder, unspecified: Secondary | ICD-10-CM

## 2016-07-10 ENCOUNTER — Ambulatory Visit (INDEPENDENT_AMBULATORY_CARE_PROVIDER_SITE_OTHER): Payer: 59 | Admitting: Psychiatry

## 2016-07-10 ENCOUNTER — Encounter (HOSPITAL_COMMUNITY): Payer: Self-pay | Admitting: Psychiatry

## 2016-07-10 DIAGNOSIS — Z833 Family history of diabetes mellitus: Secondary | ICD-10-CM | POA: Diagnosis not present

## 2016-07-10 DIAGNOSIS — Z79899 Other long term (current) drug therapy: Secondary | ICD-10-CM

## 2016-07-10 DIAGNOSIS — Z808 Family history of malignant neoplasm of other organs or systems: Secondary | ICD-10-CM

## 2016-07-10 DIAGNOSIS — Z818 Family history of other mental and behavioral disorders: Secondary | ICD-10-CM

## 2016-07-10 DIAGNOSIS — F331 Major depressive disorder, recurrent, moderate: Secondary | ICD-10-CM | POA: Diagnosis not present

## 2016-07-10 DIAGNOSIS — F17210 Nicotine dependence, cigarettes, uncomplicated: Secondary | ICD-10-CM | POA: Diagnosis not present

## 2016-07-10 MED ORDER — MIRTAZAPINE 30 MG PO TABS: 30.0000 mg | ORAL_TABLET | Freq: Every day | ORAL | 0 refills | Status: DC

## 2016-07-10 MED FILL — MIRTAZAPINE 30 MG TABLET: 30 | 90 days supply | Qty: 90 | Fill #0

## 2016-07-10 NOTE — Progress Notes (Signed)
BH MD/PA/NP OP Progress Note  07/10/2016 1:47 PM Jesse HollerQuentin D [place]chfield  MRN:  [id number]  Chief Complaint: anxiety is worse and depression persists Subjective:  Says his job remains a stressor.  Is looking for another but so far no luck.  Consequently feels anxiety all the time with worse spells due to situations.  Depression is moderate but has persisted in spite of taking buproprion HPI: longstanding depression and anxiety.  lamictal helps the depression some but not much.  Wellbutrin is neutral with the anxiety he believes.  Sleep could be better even though he works at night he does have time to sleep in the day without interruption he says.  Anxiety is the most troubling symptom at the moment he says. Visit Diagnosis:    ICD-9-CM ICD-10-CM   1. Depression, major, recurrent, moderate (HCC) 296.32 F33.1     Past Psychiatric History: no changes  Past Medical History:  Past Medical History:  Diagnosis Date  . Anxiety   . Depression   . Insomnia   . Sleep apnea     Past Surgical History:  Procedure Laterality Date  . ELBOW SURGERY  1996   RIGHT  . HERNIA REPAIR N/A 2002   [facility]    Family Psychiatric History: no changes  Family History:  Family History  Problem Relation Age of Onset  . Depression Maternal Aunt   . Diabetes Maternal Aunt   . Cancer Maternal Grandmother     pancreatic cancer  . Depression Maternal Grandmother   . Diabetes Maternal Grandmother     Social History:  Social History   Social History  . Marital status: Single    Spouse name: N/A  . Number of children: N/A  . Years of education: N/A   Social History Main Topics  . Smoking status: Current Every Day Smoker    Packs/day: 0.10    Years: 5.00    Types: Cigarettes  . Smokeless tobacco: Never Used     Comment: occassional  . Alcohol use 1.2 oz/week    1 Cans of beer, 1 Shots of liquor per week  . Drug use: No  . Sexual activity: Yes    Birth control/ protection: Condom   Other Topics  Concern  . None   Social History Narrative  . None    Allergies:  Allergies  Allergen Reactions  . Penicillins Other (See Comments)    Unknown reaction as child    Metabolic Disorder Labs: Lab Results  Component Value Date   HGBA1C 6.4 01/17/2016   No results found for: PROLACTIN Lab Results  Component Value Date   CHOL 169 10/24/2015   TRIG 147 10/24/2015   HDL 36 (L) 10/24/2015   CHOLHDL 4.7 10/24/2015   VLDL 29 10/24/2015   LDLCALC 104 10/24/2015   LDLCALC 96 10/18/2014     Current Medications: Current Outpatient Prescriptions  Medication Sig Dispense Refill  . buPROPion (WELLBUTRIN XL) 300 MG 24 hr tablet Take 1 tablet (300 mg total) by mouth daily. 90 tablet 3  . lamoTRIgine (LAMICTAL) 150 MG tablet Take 1 tablet (150 mg total) by mouth daily. 30 tablet 2  . sildenafil (VIAGRA) 50 MG tablet Take 1 tablet (50 mg total) by mouth as needed for erectile dysfunction. Reported on 10/24/2015 10 tablet 1  . valACYclovir (VALTREX) 1000 MG tablet Take 1 tablet (1,000 mg total) by mouth daily. 90 tablet 3   No current facility-administered medications for this visit.     Neurologic: Headache: Negative Seizure: Negative Paresthesias:  Negative  Musculoskeletal: Strength & Muscle Tone: within normal limits Gait & Station: normal Patient leans: N/A  Psychiatric Specialty Exam: ROS  Blood pressure 118/72, pulse 98, height 5\' 9"  (1.753 m), weight 244 lb 3.2 oz (110.8 kg).Body mass index is 36.06 kg/m.  General Appearance: Well Groomed  Eye Contact:  Good  Speech:  Clear and Coherent  Volume:  Normal  Mood:  Anxious  Affect:  Congruent  Thought Process:  Coherent and Goal Directed  Orientation:  Full (Time, Place, and Person)  Thought Content: Logical   Suicidal Thoughts:  No  Homicidal Thoughts:  No  Memory:  Immediate;   Good Recent;   Good Remote;   Good  Judgement:  Intact  Insight:  Fair  Psychomotor Activity:  Normal  Concentration:  Concentration:  Good and Attention Span: Good  Recall:  Good  Fund of Knowledge: Good  Language: Good  Akathisia:  Negative  Handed:  Right  AIMS (if indicated):  0  Assets:  Communication Skills Desire for Improvement Financial Resources/Insurance Housing Intimacy Leisure Time Physical Health Resilience Social Support Talents/Skills Transportation Vocational/Educational  ADL's:  Intact  Cognition: WNL  Sleep:  adequate     Treatment Plan Summary:Anxiety:  Try mirtazepine 30 mg hs, if 30 mg is too much initially cut it in half to 15 mg. Depression:  Continue Lamictal 150 mg daily and Wellbutrin XR 300 mg daily (prescribed by PCP) Return to clinic in one month Side effects of mirtazepine were discussed including possible weight gain   Carolanne Grumbling, MD 07/10/2016, 1:47 PM

## 2016-07-21 ENCOUNTER — Ambulatory Visit: Payer: 59 | Admitting: Licensed Clinical Social Worker

## 2016-07-22 ENCOUNTER — Ambulatory Visit (INDEPENDENT_AMBULATORY_CARE_PROVIDER_SITE_OTHER): Payer: 59 | Admitting: Pulmonary Disease

## 2016-07-22 ENCOUNTER — Encounter: Payer: Self-pay | Admitting: Pulmonary Disease

## 2016-07-22 VITALS — BP 140/80 | HR 100 | Ht 67.0 in | Wt 251.0 lb

## 2016-07-22 DIAGNOSIS — G4733 Obstructive sleep apnea (adult) (pediatric): Secondary | ICD-10-CM

## 2016-07-22 DIAGNOSIS — Z9989 Dependence on other enabling machines and devices: Secondary | ICD-10-CM

## 2016-07-22 NOTE — Progress Notes (Signed)
Current Outpatient Prescriptions on File Prior to Visit  Medication Sig  . buPROPion (WELLBUTRIN XL) 300 MG 24 hr tablet Take 1 tablet (300 mg total) by mouth daily.  . sildenafil (VIAGRA) 50 MG tablet Take 1 tablet (50 mg total) by mouth as needed for erectile dysfunction. Reported on 10/24/2015  . valACYclovir (VALTREX) 1000 MG tablet Take 1 tablet (1,000 mg total) by mouth daily.   No current facility-administered medications on file prior to visit.     Chief Complaint  Patient presents with  . Follow-up    Wears CPAP nghtly. Denies problems with mask/pressure. [facility]    Sleep tests PSG 11/30/14 >> AHI 19.9, SaO2 low 84% Auto CPAP 06/23/16 to 07/22/16 >> used on 30 of 30 nights with average 6 hrs 37 min.  Average AHI 1.6 with median CPAP 8 and 95 th percentile CPAP 11 cm H2O  Past medical history Anxiety, Depression  Past surgical history, Family history, Social history all reviewed  Vital signs BP 140/80 (BP Location: Right Arm, Cuff Size: Normal)   Pulse 100   Ht 5\' 7"  (1.702 m)   Wt 251 lb (113.9 kg)   SpO2 99%   BMI 39.31 kg/m   History of Present Illness: Jesse Shelton is a 35 y.o. male with OSA.  He is doing well with CPAP.  No issues with mask fit.  He feels rested.    He is working with behavioral health to adjust his mood medications >> started on mirtazepine 30 mg qhs.  He is also taking lamictal 150 mg daily.  Physical Exam:  General - No distress ENT - No sinus tenderness, no oral exudate, no LAN, MP 2 Cardiac - s1s2 regular, no murmur Chest - No wheeze/rales/dullness Back - No focal tenderness Abd - Soft, non-tender Ext - No edema Neuro - Normal strength Skin - No rashes Psych - normal mood, and behavior   Assessment/Plan:  Obstructive sleep apnea. - He is compliant with CPAP and reports benefit from therapy - continue auto CPAP   Patient Instructions  Follow up in 1 year    Coralyn HellingVineet Guinn Delarosa, MD [facility] Pager:  [phone] 07/22/2016, 5:05 PM

## 2016-07-22 NOTE — Patient Instructions (Signed)
Follow up in 1 year.

## 2016-07-23 ENCOUNTER — Ambulatory Visit (INDEPENDENT_AMBULATORY_CARE_PROVIDER_SITE_OTHER): Payer: 59 | Admitting: Psychology

## 2016-07-23 DIAGNOSIS — F331 Major depressive disorder, recurrent, moderate: Secondary | ICD-10-CM

## 2016-07-28 ENCOUNTER — Ambulatory Visit (INDEPENDENT_AMBULATORY_CARE_PROVIDER_SITE_OTHER): Payer: 59 | Admitting: Licensed Clinical Social Worker

## 2016-07-28 DIAGNOSIS — F3341 Major depressive disorder, recurrent, in partial remission: Secondary | ICD-10-CM | POA: Diagnosis not present

## 2016-08-06 ENCOUNTER — Ambulatory Visit (INDEPENDENT_AMBULATORY_CARE_PROVIDER_SITE_OTHER): Payer: 59 | Admitting: Psychology

## 2016-08-06 DIAGNOSIS — F319 Bipolar disorder, unspecified: Secondary | ICD-10-CM | POA: Diagnosis not present

## 2016-08-11 ENCOUNTER — Ambulatory Visit: Payer: Self-pay | Admitting: Licensed Clinical Social Worker

## 2016-08-14 ENCOUNTER — Ambulatory Visit (HOSPITAL_COMMUNITY): Payer: Self-pay | Admitting: Psychiatry

## 2016-08-18 ENCOUNTER — Encounter (HOSPITAL_COMMUNITY): Payer: Self-pay | Admitting: Psychiatry

## 2016-08-18 ENCOUNTER — Ambulatory Visit (INDEPENDENT_AMBULATORY_CARE_PROVIDER_SITE_OTHER): Payer: 59 | Admitting: Psychiatry

## 2016-08-18 DIAGNOSIS — F330 Major depressive disorder, recurrent, mild: Secondary | ICD-10-CM

## 2016-08-18 DIAGNOSIS — Z79899 Other long term (current) drug therapy: Secondary | ICD-10-CM | POA: Diagnosis not present

## 2016-08-18 MED ORDER — BUPROPION HCL ER (XL) 300 MG PO TB24: 300.0000 mg | ORAL_TABLET | Freq: Every day | ORAL | 2 refills | Status: DC

## 2016-08-18 MED ORDER — MIRTAZAPINE 30 MG PO TABS: 30.0000 mg | ORAL_TABLET | Freq: Every day | ORAL | 2 refills | Status: DC

## 2016-08-18 MED ORDER — LAMOTRIGINE 150 MG PO TABS: 150.0000 mg | ORAL_TABLET | Freq: Every day | ORAL | 2 refills | Status: DC

## 2016-08-18 NOTE — Progress Notes (Signed)
[facility] [id number] Progress Note  Charlott HollerQuentin D Shelton [id number] 35 y.o.  08/18/2016 2:08 PM  Chief Complaint:  I'm sleeping better with the medication.       History of Present Illness:  Jesse MandesQuentin came for his follow-up appointment.  He was last seen by [name] recommended to add Remeron 15-30 mg as patient continued to address anxiety and poor sleep.  His main stress is work.  He is also seeing Dr. [name] for counseling.  He reported no side effects.  He admitted some nights he does not take full dose of Remeron because it makes him tired today.  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  He likes Lamictal which helps his anger and irritability.  Patient has no other concern for psychiatric medication.  He wants to continue Wellbutrin, Lamictal and Remeron.  He admitted some time he feels stressed out and irritable but overall he feels medicine helping him.  His other stressor is his marriage and he feels counselor is helping him.  His sleep is good.  He denies any mania, psychosis or any suicidal thoughts.  Patient denies drinking alcohol or using any illegal substances.  He has no rash, itching or any headaches.  Patient is scheduled to see his primary care physician in February.  His appetite is okay but he had increase his weight in past few months.  Suicidal Ideation: No Plan Formed: No Patient has means to carry out plan: No  Homicidal Ideation: No Plan Formed: No Patient has means to carry out plan: No  Past Psychiatric History/Hospitalization(s): Patient has history of depression 9 years ago when he was living in [place].  He saw Dr. Rolley SimsHampton and he was given Prozac and Wellbutrin.  He he tried also trazodone but it make him very sleepy.  Patient denies any history of mania, psychosis, hallucination, suicidal attempt or any aggressive behavior.  He denies any history of sexual, verbal or physical abuse.  He never had any  psychological testing .   Anxiety: Yes Bipolar Disorder: No Depression: Yes Mania: No Psychosis: No Schizophrenia: No Personality Disorder: No Hospitalization for psychiatric illness: No History of Electroconvulsive Shock Therapy: No Prior Suicide Attempts: No  Family History; Patient reported  multiple family member has depression.  Medical History; Patient has erectile dysfunction.  [facility].  Patient denies any history of seizures .    Review of Systems  Constitutional: Negative.   HENT: Negative.   Eyes: Negative.   Respiratory: Negative.   Cardiovascular: Negative.   Gastrointestinal: Negative.   Musculoskeletal: Negative.   Skin: Negative.     Psychiatric: Agitation: No Hallucination: No Depressed Mood: No Insomnia: No Hypersomnia: No Altered Concentration: No Feels Worthless: No Grandiose Ideas: No Belief In Special Powers: No New/Increased Substance Abuse: No Compulsions: No  Neurologic: Headache: No Seizure: No Paresthesias: No   Outpatient Encounter Prescriptions as of 08/18/2016  Medication Sig  . buPROPion (WELLBUTRIN XL) 300 MG 24 hr tablet Take 1 tablet (300 mg total) by mouth daily.  . mirtazapine (REMERON) 30 MG tablet Take 1 tablet (30 mg total) by mouth at bedtime.  . sildenafil (VIAGRA) 50 MG tablet Take 1 tablet (50 mg total) by mouth as needed for erectile dysfunction. Reported on 10/24/2015  . valACYclovir (VALTREX) 1000 MG tablet Take 1 tablet (1,000 mg total) by mouth daily.  . [DISCONTINUED] buPROPion (WELLBUTRIN XL) 300 MG 24 hr  tablet Take 1 tablet (300 mg total) by mouth daily.  . [DISCONTINUED] mirtazapine (REMERON) 30 MG tablet Take 30 mg by mouth at bedtime.  . lamoTRIgine (LAMICTAL) 150 MG tablet Take 1 tablet (150 mg total) by mouth daily.  . [DISCONTINUED] lamoTRIgine (LAMICTAL) 150 MG tablet Take 150 mg by mouth daily.   No facility-administered encounter medications on file as of  08/18/2016.     No results found for this or any previous visit (from the past 2160 hour(s)).    Constitutional:  BP 126/78   Pulse 87   Ht 5\' 9"  (1.753 m)   Wt 259 lb 6.4 oz (117.7 kg)   BMI 38.31 kg/m    Musculoskeletal: Strength & Muscle Tone: within normal limits Gait & Station: normal Patient leans: N/A  Psychiatric Specialty Exam: General Appearance: Casual and Fairly Groomed  Patent attorneyye Contact::  Good  Speech:  Clear and Coherent and Slow  Volume:  Normal  Mood:  Euthymic  Affect:  Appropriate  Thought Process:  Coherent  Orientation:  Full (Time, Place, and Person)  Thought Content:  WDL and Logical  Suicidal Thoughts:  No  Homicidal Thoughts:  No  Memory:  Immediate;   Fair Recent;   Fair Remote;   Fair  Judgement:  Good  Insight:  Fair  Psychomotor Activity:  Normal  Concentration:  Fair  Recall:  Good  Fund of Knowledge:  Good  Language:  Good  Akathisia:  No  Handed:  Right  AIMS (if indicated):     Assets:  Communication Skills Desire for Improvement Housing Physical Health  ADL's:  Intact  Cognition:  WNL  Sleep:        Established Problem, Stable/Improving (1), Review of Psycho-Social Stressors (1), Review and summation of old records (2), Review of Last Therapy Session (1) and Review of Medication Regimen & Side Effects (2)  Assessment: Axis I: Major depressive disorder, recurrent mild  Axis II: Deferred  Axis III:  Past Medical History:  Diagnosis Date  . Anxiety   . Depression   . Insomnia   . Sleep apnea      Plan:  I review notes from [name] .  Patient is doing better on Remeron.  He taking Remeron 15-30 mg at bedtime.  I will continue Wellbutrin XL 300 mg daily, Lamictal 150 mg daily and Remeron 15-30 mg at bedtime.  Discussed medication side effects and benefits.  Encouraged to lose his weight as Remeron can cause weight gain.  Patient is scheduled to see his primary care physician in February.  He is also seeing Dr.  [name] for counseling.  Recommended to call us back if he has any question or any concern.  Follow-up in 3 months.  Discuss safety plan that anytime having active suicidal thoughts or homicidal thoughts.  He need to call 911 or go to the local emergency room.  Maclovia Uher T., MD 08/18/2016

## 2016-08-20 ENCOUNTER — Ambulatory Visit: Payer: 59 | Admitting: Psychology

## 2016-08-27 ENCOUNTER — Ambulatory Visit (INDEPENDENT_AMBULATORY_CARE_PROVIDER_SITE_OTHER): Payer: 59 | Admitting: Licensed Clinical Social Worker

## 2016-08-27 DIAGNOSIS — F3341 Major depressive disorder, recurrent, in partial remission: Secondary | ICD-10-CM

## 2016-09-03 ENCOUNTER — Ambulatory Visit (INDEPENDENT_AMBULATORY_CARE_PROVIDER_SITE_OTHER): Payer: 59 | Admitting: Psychology

## 2016-09-03 DIAGNOSIS — G4733 Obstructive sleep apnea (adult) (pediatric): Secondary | ICD-10-CM | POA: Diagnosis not present

## 2016-09-03 DIAGNOSIS — F319 Bipolar disorder, unspecified: Secondary | ICD-10-CM

## 2016-09-09 DIAGNOSIS — G4733 Obstructive sleep apnea (adult) (pediatric): Secondary | ICD-10-CM | POA: Diagnosis not present

## 2016-09-10 ENCOUNTER — Ambulatory Visit (INDEPENDENT_AMBULATORY_CARE_PROVIDER_SITE_OTHER): Payer: 59 | Admitting: Licensed Clinical Social Worker

## 2016-09-10 DIAGNOSIS — F3341 Major depressive disorder, recurrent, in partial remission: Secondary | ICD-10-CM

## 2016-09-17 ENCOUNTER — Ambulatory Visit (INDEPENDENT_AMBULATORY_CARE_PROVIDER_SITE_OTHER): Payer: 59 | Admitting: Psychology

## 2016-09-17 DIAGNOSIS — F3341 Major depressive disorder, recurrent, in partial remission: Secondary | ICD-10-CM

## 2016-09-24 ENCOUNTER — Ambulatory Visit (INDEPENDENT_AMBULATORY_CARE_PROVIDER_SITE_OTHER): Payer: 59 | Admitting: Licensed Clinical Social Worker

## 2016-09-24 DIAGNOSIS — F3341 Major depressive disorder, recurrent, in partial remission: Secondary | ICD-10-CM | POA: Diagnosis not present

## 2016-10-01 ENCOUNTER — Ambulatory Visit (INDEPENDENT_AMBULATORY_CARE_PROVIDER_SITE_OTHER): Payer: 59 | Admitting: Psychology

## 2016-10-01 DIAGNOSIS — F319 Bipolar disorder, unspecified: Secondary | ICD-10-CM

## 2016-10-08 ENCOUNTER — Ambulatory Visit (INDEPENDENT_AMBULATORY_CARE_PROVIDER_SITE_OTHER): Payer: 59 | Admitting: Licensed Clinical Social Worker

## 2016-10-08 DIAGNOSIS — F3341 Major depressive disorder, recurrent, in partial remission: Secondary | ICD-10-CM | POA: Diagnosis not present

## 2016-10-14 ENCOUNTER — Ambulatory Visit (INDEPENDENT_AMBULATORY_CARE_PROVIDER_SITE_OTHER): Payer: 59 | Admitting: Psychology

## 2016-10-14 DIAGNOSIS — F319 Bipolar disorder, unspecified: Secondary | ICD-10-CM

## 2016-10-23 MED FILL — BUPROPION HCL XL 300 MG TAB: 300 | 90 days supply | Qty: 90 | Fill #3

## 2016-10-23 MED FILL — SILDENAFIL 50 MG TABLET: 50 | 30 days supply | Qty: 6 | Fill #1

## 2016-10-23 MED FILL — valACYclovir HCL 1 GM TABS: 1 | 90 days supply | Qty: 90 | Fill #3

## 2016-10-23 MED FILL — MIRTAZAPINE 30 MG TABLET: 30 | 30 days supply | Qty: 30 | Fill #0

## 2016-10-23 MED FILL — lamoTRIgine 150 MG TABS: 150 | 30 days supply | Qty: 30 | Fill #0

## 2016-10-24 ENCOUNTER — Ambulatory Visit (INDEPENDENT_AMBULATORY_CARE_PROVIDER_SITE_OTHER): Payer: 59 | Admitting: Licensed Clinical Social Worker

## 2016-10-24 DIAGNOSIS — F3341 Major depressive disorder, recurrent, in partial remission: Secondary | ICD-10-CM

## 2016-10-31 ENCOUNTER — Encounter: Payer: Self-pay | Admitting: Family Medicine

## 2016-10-31 ENCOUNTER — Ambulatory Visit (INDEPENDENT_AMBULATORY_CARE_PROVIDER_SITE_OTHER): Payer: 59 | Admitting: Family Medicine

## 2016-10-31 VITALS — BP 128/82 | HR 91 | Temp 98.3°F | Ht 67.0 in | Wt 256.4 lb

## 2016-10-31 DIAGNOSIS — N529 Male erectile dysfunction, unspecified: Secondary | ICD-10-CM | POA: Diagnosis not present

## 2016-10-31 DIAGNOSIS — G4733 Obstructive sleep apnea (adult) (pediatric): Secondary | ICD-10-CM

## 2016-10-31 DIAGNOSIS — R7303 Prediabetes: Secondary | ICD-10-CM

## 2016-10-31 DIAGNOSIS — R69 Illness, unspecified: Secondary | ICD-10-CM

## 2016-10-31 DIAGNOSIS — B009 Herpesviral infection, unspecified: Secondary | ICD-10-CM

## 2016-10-31 DIAGNOSIS — F330 Major depressive disorder, recurrent, mild: Secondary | ICD-10-CM | POA: Diagnosis not present

## 2016-10-31 LAB — POCT GLYCOSYLATED HEMOGLOBIN (HGB A1C): Hemoglobin A1C: 6.2

## 2016-10-31 MED ORDER — VALACYCLOVIR HCL 1 G PO TABS: 1000.0000 mg | ORAL_TABLET | Freq: Every day | ORAL | 3 refills | Status: DC

## 2016-10-31 MED ORDER — MIRTAZAPINE 30 MG PO TABS: 30.0000 mg | ORAL_TABLET | Freq: Every day | ORAL | 3 refills | Status: DC

## 2016-10-31 MED ORDER — BUPROPION HCL ER (XL) 300 MG PO TB24: 300.0000 mg | ORAL_TABLET | Freq: Every day | ORAL | 3 refills | Status: DC

## 2016-10-31 MED ORDER — SILDENAFIL CITRATE 50 MG PO TABS: 50.0000 mg | ORAL_TABLET | ORAL | 1 refills | Status: DC | PRN

## 2016-10-31 NOTE — Assessment & Plan Note (Addendum)
Will check BMP, A1C, and Lipid Panel today. Follow up in one year or sooner pending labs.

## 2016-10-31 NOTE — Assessment & Plan Note (Signed)
Bupropion and Remeron refilled. [facility]

## 2016-10-31 NOTE — Patient Instructions (Signed)
Thank you so much for coming to visit today! We will check several labs today and we will mail you a letter with the results. Your medications were refilled. Please let us know if you become interested in quitting smoking. Please return in one year or sooner if needed.  Dr. Caroleen Hammanumley

## 2016-10-31 NOTE — Assessment & Plan Note (Signed)
Continue CPAP.  

## 2016-10-31 NOTE — Assessment & Plan Note (Signed)
Viagra refilled

## 2016-10-31 NOTE — Assessment & Plan Note (Signed)
Continue suppressive therapy at this time. Valtrex refilled

## 2016-10-31 NOTE — Progress Notes (Signed)
Subjective:     Patient ID: Jesse Shelton, male   DOB: 02/14/1981, 36 y.o.   MRN: [id number]  HPI Mr. [name] is a 36yo male presenting today for annual follow up of anxiety/depression,pre-diabetes, erectile dysfunction, and herpes. Reports he [facility] with [facility] and has a therapist there. Depression is currently well controlled. Currently prescribed Bupropion and Remeron. Requests refill of these medications. History of erectile dysfunction. Viagra has been working well and requests refill today. Notes history of herpes. Reports he is on chronic suppressive therapy with Valtrex. Notes he only had initial outbreak and denies outbreak since first diagnosis. Discussed that suppressive therapy is not normally used without multiple outbreaks--he believes his anxiety and depression will make him have more outbreaks due to stress and wishes to continue suppressive therapy at this time. Requests refill of Valtrex. He has been using his CPAP nightly and notes improvement in how he feels and in his blood pressure. Smoker. Not interested in quitting. Denies any acute concerns Denies any changes in personal or family history. Medications reviewed and updated.   Review of Systems Per HPI    Objective:   Physical Exam  Constitutional: He appears well-developed and well-nourished. No distress.  HENT:  Head: Normocephalic and atraumatic.  Left and right tympanic membrane normal. Middle ear appears normal.  Cardiovascular: Normal rate and regular rhythm.   No murmur heard. Pulmonary/Chest: Effort normal. No respiratory distress. He has no wheezes.  Abdominal: Soft. He exhibits no distension. There is no tenderness.  Musculoskeletal: He exhibits no edema.  Skin: No rash noted.  Psychiatric: He has a normal mood and affect. His behavior is normal.      Assessment and Plan:     OSA (obstructive sleep apnea) Continue CPAP  ED (erectile dysfunction) of organic  origin Viagra refilled  Mixed anxiety and depressive disorder Bupropion and Remeron refilled. Continue to follow with [facility]  Herpes Continue suppressive therapy at this time. Valtrex refilled  Pre-diabetes Will check BMP, A1C, and Lipid Panel today. Follow up in one year or sooner pending labs.

## 2016-11-05 ENCOUNTER — Ambulatory Visit: Payer: Self-pay | Admitting: Licensed Clinical Social Worker

## 2016-11-11 ENCOUNTER — Ambulatory Visit: Payer: 59 | Admitting: Psychology

## 2016-11-12 ENCOUNTER — Ambulatory Visit: Payer: 59 | Admitting: Licensed Clinical Social Worker

## 2016-11-13 ENCOUNTER — Encounter: Payer: Self-pay | Admitting: Family Medicine

## 2016-11-19 ENCOUNTER — Ambulatory Visit (HOSPITAL_COMMUNITY): Payer: Self-pay | Admitting: Psychiatry

## 2016-11-27 LAB — BASIC METABOLIC PANEL
BUN / CREAT RATIO: 8 — AB (ref 9–20)
BUN: 10 mg/dL (ref 6–20)
CALCIUM: 9.5 mg/dL (ref 8.7–10.2)
CHLORIDE: 101 mmol/L (ref 96–106)
CO2: 25 mmol/L (ref 18–29)
Creatinine, Ser: 1.19 mg/dL (ref 0.76–1.27)
GFR calc Af Amer: 92 mL/min/{1.73_m2} (ref >59)
GFR calc non Af Amer: 80 mL/min/{1.73_m2} (ref >59)
Glucose: 112 mg/dL — ABNORMAL HIGH (ref 65–99)
Potassium: 4.3 mmol/L (ref 3.5–5.2)
Sodium: 140 mmol/L (ref 134–144)

## 2016-11-27 LAB — LIPID PANEL
CHOL/HDL RATIO: 5.3 ratio — AB (ref 0.0–5.0)
Cholesterol, Total: 176 mg/dL (ref 100–199)
HDL: 33 mg/dL — ABNORMAL LOW (ref >39)
LDL Calculated: 113 mg/dL — ABNORMAL HIGH (ref 0–99)
Triglycerides: 149 mg/dL (ref 0–149)
VLDL Cholesterol Cal: 30 mg/dL (ref 5–40)

## 2017-02-02 ENCOUNTER — Telehealth: Payer: Self-pay | Admitting: Family Medicine

## 2017-02-02 NOTE — Telephone Encounter (Signed)
Patient has a long list of labs he needs done, not necessarily to be done here.  He needs his doctor to sign off that he can have them done.  Please call patient w/questions at [phone].

## 2017-02-03 ENCOUNTER — Other Ambulatory Visit: Payer: Self-pay | Admitting: Family Medicine

## 2017-02-03 ENCOUNTER — Encounter: Payer: Self-pay | Admitting: Family Medicine

## 2017-02-03 NOTE — Telephone Encounter (Signed)
Correction: Patient does not need A1c given he was checked 10/2016 and does not have active diabetes. He does not need to be screened until next year given that he did not meet an A1c of 6.5. Please call informed patient he does not need to come in for labs. Thanks.

## 2017-02-03 NOTE — Telephone Encounter (Signed)
Will forward to PCP (see my chart message from today)

## 2017-02-03 NOTE — Telephone Encounter (Signed)
Please call patient to inform him he can come and get an POCT for his A1c. I am not going to put an order for an allergy test for penicillin or a "wellness lab" given a recent BMET on 10/2016 which was unremarkable. Thanks. -- Durward Parcelavid Keah Lamba, DO [facility], PGY-1

## 2017-02-04 NOTE — Telephone Encounter (Signed)
This has been addressed (see mychart message).

## 2017-03-17 ENCOUNTER — Ambulatory Visit (INDEPENDENT_AMBULATORY_CARE_PROVIDER_SITE_OTHER): Payer: 59 | Admitting: Allergy and Immunology

## 2017-03-17 ENCOUNTER — Encounter: Payer: Self-pay | Admitting: Allergy and Immunology

## 2017-03-17 VITALS — BP 130/82 | HR 94 | Temp 98.6°F | Resp 18 | Ht 69.2 in | Wt 252.4 lb

## 2017-03-17 DIAGNOSIS — J3089 Other allergic rhinitis: Secondary | ICD-10-CM

## 2017-03-17 DIAGNOSIS — F17210 Nicotine dependence, cigarettes, uncomplicated: Secondary | ICD-10-CM | POA: Diagnosis not present

## 2017-03-17 DIAGNOSIS — H1045 Other chronic allergic conjunctivitis: Secondary | ICD-10-CM | POA: Diagnosis not present

## 2017-03-17 DIAGNOSIS — Z880 Allergy status to penicillin: Secondary | ICD-10-CM

## 2017-03-17 DIAGNOSIS — H1010 Acute atopic conjunctivitis, unspecified eye: Secondary | ICD-10-CM

## 2017-03-17 MED ORDER — AMOXICILLIN 250 MG/5ML PO SUSR: ORAL | 0 refills | Status: DC

## 2017-03-17 NOTE — Patient Instructions (Addendum)
  1. Allergen avoidance measures  2. Treat and prevent inflammation:   A. OTC Nasacort/Rhinocort - one spray each nostril 3-7 times per week  3. If needed:   A. OTC antihistamine - Claritin/Allegra/Zyrtec  4. Consider a course of immunotherapy if medical treatment ineffective  5. Arrange for amoxicillin challenge with dry powder prescription  6. Use a nicotine substitute to replace tobacco smoke exposure

## 2017-03-17 NOTE — Progress Notes (Signed)
NEW PATIENT NOTE  Referring Provider: No ref. provider found Primary Provider: Wendee BeaversMcMullen, David J, DO Date of office visit: 03/17/2017    Subjective:   Chief Complaint:  Jesse Shelton (DOB: 1981/04/30) is a 36 y.o. male who presents to the clinic on 03/17/2017 with a chief complaint of Nasal Congestion .  HPI: Jesse MandesQuentin presents to this clinic in evaluation of 2 main issues.  First, he has a history of nasal congestion and sneezing and itchy red watery eyes and diminished ability to smell occurring on a perennial basis with some seasonality with the spring and fall season following exposure to the outdoors and exposure to cat and dog. This is been a long-standing issue since early childhood and may be somewhat progressive. He does not have a history of ugly nasal discharge or recurrent headaches or other type of respiratory tract symptoms. He has tried various antihistamines and has tried Flonase in the past which have been ineffective in alleviating his symptoms.  Second, he has a history of penicillin allergy. He cannot remember the details of this history as it happened very early in childhood and his mom never detailed him on this reaction. He has not had any penicillin family medications administered as far as he can remember.  As well, he does smoke tobacco at a rate of less than a few cigarettes per day and has done so for many years. Over the course the past 3 months he has switched over to non-combustible nicotine inhalation.  Past Medical History:  Diagnosis Date  . Anxiety   . Depression   . Herpes   . Insomnia   . Sleep apnea     Past Surgical History:  Procedure Laterality Date  . BONE MARROW HARVEST  09/2011  . ELBOW SURGERY  1996   RIGHT  . HERNIA REPAIR N/A 2002   [facility]    Allergies as of 03/17/2017      Reactions   Penicillins Other (See Comments)   Unknown reaction as child      Medication List      buPROPion 300 MG 24 hr tablet Commonly  known as:  WELLBUTRIN XL Take 1 tablet (300 mg total) by mouth daily.   mirtazapine 30 MG tablet Commonly known as:  REMERON Take 1 tablet (30 mg total) by mouth at bedtime.   sildenafil 50 MG tablet Commonly known as:  VIAGRA Take 1 tablet (50 mg total) by mouth as needed for erectile dysfunction. Reported on 10/24/2015   valACYclovir 1000 MG tablet Commonly known as:  VALTREX Take 1 tablet (1,000 mg total) by mouth daily.       Review of systems negative except as noted in HPI / PMHx or noted below:  Review of Systems  Constitutional: Negative.   HENT: Negative.   Eyes: Negative.   Respiratory: Negative.   Cardiovascular: Negative.   Gastrointestinal: Negative.   Genitourinary: Negative.   Musculoskeletal: Negative.   Skin: Negative.   Neurological: Negative.   Endo/Heme/Allergies: Negative.   Psychiatric/Behavioral: Negative.     Family History  Problem Relation Age of Onset  . Cancer Maternal Grandmother        pancreatic cancer  . Depression Maternal Grandmother   . Diabetes Maternal Grandmother   . Depression Maternal Aunt   . Diabetes Maternal Aunt     Social History   Social History  . Marital status: Single    Spouse name: N/A  . Number of children: N/A  . Years  of education: N/A   Occupational History  . Not on file.   Social History Main Topics  . Smoking status: Current Every Day Smoker    Packs/day: 0.10    Years: 5.00    Types: E-cigarettes  . Smokeless tobacco: Never Used     Comment:  vapes  . Alcohol use 1.2 oz/week    1 Cans of beer, 1 Shots of liquor per week  . Drug use: No  . Sexual activity: Yes    Birth control/ protection: Condom   Other Topics Concern  . Not on file   Social History Narrative  . No narrative on file    Environmental and Social history  Lives in a apartment with a dry environment, a cat and dog located inside the household, carpeting in the bedroom, no plastic on the bed and no plastic on the pillow,  actively using noncombustible nicotine inhalation, and employment as a phlebotomist.  Objective:   Vitals:   03/17/17 0829  BP: 130/82  Pulse: 94  Resp: 18  Temp: 98.6 F (37 C)   Height: 5' 9.2" (175.8 cm) Weight: 252 lb 6.4 oz (114.5 kg)  Physical Exam  Constitutional: He is well-developed, well-nourished, and in no distress.  HENT:  Head: Normocephalic. Head is without right periorbital erythema and without left periorbital erythema.  Right Ear: Tympanic membrane, external ear and ear canal normal.  Left Ear: Tympanic membrane, external ear and ear canal normal.  Nose: Mucosal edema present. No rhinorrhea.  Mouth/Throat: Oropharynx is clear and moist and mucous membranes are normal. No oropharyngeal exudate.  Eyes: Lids are normal. Pupils are equal, round, and reactive to light. Right conjunctiva is injected. Left conjunctiva is injected.  Neck: Trachea normal. No tracheal deviation present. No thyromegaly present.  Cardiovascular: Normal rate, regular rhythm, S1 normal, S2 normal and normal heart sounds.   No murmur heard. Pulmonary/Chest: Effort normal. No stridor. No tachypnea. No respiratory distress. He has no wheezes. He has no rales. He exhibits no tenderness.  Abdominal: Soft. He exhibits no distension and no mass. There is no hepatosplenomegaly. There is no tenderness. There is no rebound and no guarding.  Musculoskeletal: He exhibits no edema or tenderness.  Lymphadenopathy:       Head (right side): No tonsillar adenopathy present.       Head (left side): No tonsillar adenopathy present.    He has no cervical adenopathy.    He has no axillary adenopathy.  Neurological: He is alert. Gait normal.  Skin: No rash noted. He is not diaphoretic. No erythema. No pallor. Nails show no clubbing.  Psychiatric: Mood and affect normal.    Diagnostics: Allergy skin tests were performed. He demonstrated hypersensitivity to grasses, molds, and dust mite  Assessment and Plan:     1. Other allergic rhinitis   2. Seasonal allergic conjunctivitis   3. Penicillin allergy   4. Tobacco smoker, less than 10 cigarettes per day     1. Allergen avoidance measures  2. Treat and prevent inflammation:   A. OTC Nasacort/Rhinocort - one spray each nostril 3-7 times per week  3. If needed:   A. OTC antihistamine - Claritin/Allegra/Zyrtec  4. Consider a course of immunotherapy if medical treatment ineffective  5. Arrange for amoxicillin challenge with dry powder prescription  6. Use a nicotine substitute to replace tobacco smoke exposure  Jesse Shelton appears to have atopic disease affecting his respiratory tract and eyes and we will get him to perform allergen avoidance measures as best  as possible and utilizing anti-inflammatory medications for his respiratory tract as noted above. If he does not develop a good response with this approach he should definitely consider a course of immunotherapy. As well, we will work through his history of penicillin reactivity by providing him an amoxicillin challenge in this clinic. Finally, I did have a talk with him today about the need to make sure that he has no exposure to tobacco smoke in the future.  Laurette Schimke, MD Allergy / Immunology [facility]

## 2017-06-23 ENCOUNTER — Encounter: Payer: Self-pay | Admitting: Family Medicine

## 2017-06-25 ENCOUNTER — Encounter: Payer: Self-pay | Admitting: Family Medicine

## 2017-06-25 ENCOUNTER — Other Ambulatory Visit: Payer: Self-pay | Admitting: Family Medicine

## 2017-06-25 DIAGNOSIS — Z9189 Other specified personal risk factors, not elsewhere classified: Secondary | ICD-10-CM

## 2017-06-26 ENCOUNTER — Other Ambulatory Visit: Payer: Self-pay | Admitting: Family Medicine

## 2017-06-27 LAB — RPR: RPR Ser Ql: NONREACTIVE

## 2017-06-27 LAB — HIV ANTIBODY (ROUTINE TESTING W REFLEX): HIV Screen 4th Generation wRfx: NONREACTIVE

## 2017-07-06 ENCOUNTER — Encounter: Payer: Self-pay | Admitting: Family Medicine

## 2017-08-28 MED FILL — CLINDAMYCIN HCL 150 MG CAPS: 150 | 7 days supply | Qty: 28 | Fill #0

## 2017-08-28 MED FILL — HYDROCODON-APAP 10-325: 10-325 | 5 days supply | Qty: 20 | Fill #0

## 2017-11-09 NOTE — Progress Notes (Signed)
   Subjective   Patient ID: Jesse Shelton    DOB: [birth year], 37 y.o. male   MRN: [id number]  CC: "Physical exam"  HPI: Jesse Shelton is a 37 y.o. male who presents to clinic today for the following:  Prediabetes: Patient not taking efforts to exercise.  He is careful about his carbohydrate.  Wife cooks regularly and eat out approximately once per week.  Patient drinks primarily water and avoids sugary beverages.  Patient denies chest pain, shortness of breath, nausea or vomiting, abdominal pain, polyuria, polydipsia, polyphagia.  High risk sexual behavior: Patient sexually active with male partners only.  Has approximately 80 male partners in the last year.  Does not use condoms regularly.  Patient requesting screening today.  He denies dysuria, penile discharge, rash, joint swelling or pain.  OSA: Patient compliant with CPAP nightly.  He states he did tell a difference when he is not using his CPAP.  Patient feels well rested in the daytime.  ROS: see HPI for pertinent.  [place]: Pre-diabetes, ED, morbid obesity, OSA, MDD.  Surgical history hernia, right elbow, bone marrow harvest.  Family history unremarkable.  Smoking status reviewed. Medications reviewed.  Objective   BP 128/82   Pulse 72   Temp 98 F (36.7 C) (Oral)   Wt 247 lb 12.8 oz (112.4 kg)   SpO2 98%   BMI 36.38 kg/m  Vitals and nursing note reviewed.  General: well nourished, well developed, NAD with non-toxic appearance HEENT: normocephalic, atraumatic, moist mucous membranes, tongue piercing present Neck: supple, non-tender without lymphadenopathy Cardiovascular: regular rate and rhythm without murmurs, rubs, or gallops Lungs: clear to auscultation bilaterally with normal work of breathing Abdomen: soft, non-tender, non-distended, normoactive bowel sounds Skin: warm, dry, no rashes or lesions, cap refill < 2 seconds Extremities: warm and well perfused, normal tone, no edema  Assessment & Plan   High  risk sexual behavior Chronic.  Male partners only.  Intermittent use of protection. - Will check gonorrhea chlamydia, RPR, HIV  Pre-diabetes Chronic.  No family history of diabetes.  Not making efforts to exercise.  Seems to understand importance of diet modification.  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  Patient also insisting on additional testing without indication.  I discussed at length about proper testing increased risk for false positives and false negatives.  Agreeable to check vitamin D level per patient's request.  Patient understands he may have to be out of pocket. - Will check A1c, CMET, CBC with differential, LDL, TSH, vitamin D 25-hydroxy - Administered flu vaccine  OSA (obstructive sleep apnea) Chronic.  Well controlled with use of CPAP. - Advised patient to continue using CPAP nightly  Orders Placed This Encounter  Procedures  . Flu Vaccine QUAD 36+ mos IM  . CMP14+EGFR  . CBC with Differential/Platelet  . LDL cholesterol, direct  . VITAMIN D 25 Hydroxy (Vit-D Deficiency, Fractures)  . TSH  . RPR  . HIV antibody  . Hemoglobin A1c   No orders of the defined types were placed in this encounter.   Harriet Butte, [place], PGY-2 11/10/2017, 12:24 PM

## 2017-11-10 ENCOUNTER — Ambulatory Visit (INDEPENDENT_AMBULATORY_CARE_PROVIDER_SITE_OTHER): Payer: Managed Care, Other (non HMO) | Admitting: Family Medicine

## 2017-11-10 ENCOUNTER — Other Ambulatory Visit: Payer: Self-pay

## 2017-11-10 ENCOUNTER — Other Ambulatory Visit: Payer: Self-pay | Admitting: Family Medicine

## 2017-11-10 ENCOUNTER — Encounter: Payer: Self-pay | Admitting: Family Medicine

## 2017-11-10 VITALS — BP 128/82 | HR 72 | Temp 98.0°F | Wt 247.8 lb

## 2017-11-10 DIAGNOSIS — Z7251 High risk heterosexual behavior: Secondary | ICD-10-CM | POA: Insufficient documentation

## 2017-11-10 DIAGNOSIS — Z0000 Encounter for general adult medical examination without abnormal findings: Secondary | ICD-10-CM

## 2017-11-10 DIAGNOSIS — Z23 Encounter for immunization: Secondary | ICD-10-CM

## 2017-11-10 DIAGNOSIS — R7303 Prediabetes: Secondary | ICD-10-CM

## 2017-11-10 DIAGNOSIS — G4733 Obstructive sleep apnea (adult) (pediatric): Secondary | ICD-10-CM | POA: Diagnosis not present

## 2017-11-10 DIAGNOSIS — Z1321 Encounter for screening for nutritional disorder: Secondary | ICD-10-CM

## 2017-11-10 NOTE — Assessment & Plan Note (Addendum)
Chronic.  Male partners only.  Intermittent use of protection. - Will check gonorrhea chlamydia, RPR, HIV

## 2017-11-10 NOTE — Assessment & Plan Note (Addendum)
Chronic.  Well controlled with use of CPAP. - Advised patient to continue using CPAP nightly

## 2017-11-10 NOTE — Assessment & Plan Note (Addendum)
Chronic.  No family history of diabetes.  Not making efforts to exercise.  Seems to understand importance of diet modification.  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  Patient also insisting on additional testing without indication.  I discussed at length about proper testing increased risk for false positives and false negatives.  Agreeable to check vitamin D level per patient's request.  Patient understands he may have to be out of pocket. - Will check A1c, CMET, CBC with differential, LDL, TSH, vitamin D 25-hydroxy - Administered flu vaccine

## 2017-11-10 NOTE — Patient Instructions (Signed)
Thank you for coming in to see Jesse Shelton today. Please see below to review our plan for today's visit.  I will follow-up with your lab results if there is any abnormalities.  Otherwise expect to receive results in the mail.  It is important that you routinely exercise during the week.  This is good for both the body and for the mind.  Even a little bit of exercise to go a long way to better health.  Please call the clinic at [phone] if your symptoms worsen or you have any concerns. It was our pleasure to serve you.  Durward Parcelavid McMullen, DO [facility], PGY-2

## 2017-11-11 LAB — HEMOGLOBIN A1C
Est. average glucose Bld gHb Est-mCnc: 137 mg/dL
Hgb A1c MFr Bld: 6.4 % — ABNORMAL HIGH (ref 4.8–5.6)

## 2017-11-11 LAB — CMP14+EGFR
A/G RATIO: 1.6 (ref 1.2–2.2)
ALBUMIN: 4.6 g/dL (ref 3.5–5.5)
ALT: 25 IU/L (ref 0–44)
AST: 15 IU/L (ref 0–40)
Alkaline Phosphatase: 86 IU/L (ref 39–117)
BILIRUBIN TOTAL: 0.4 mg/dL (ref 0.0–1.2)
BUN / CREAT RATIO: 9 (ref 9–20)
BUN: 11 mg/dL (ref 6–20)
CO2: 24 mmol/L (ref 20–29)
Calcium: 9.8 mg/dL (ref 8.7–10.2)
Chloride: 104 mmol/L (ref 96–106)
Creatinine, Ser: 1.16 mg/dL (ref 0.76–1.27)
GFR calc non Af Amer: 82 mL/min/{1.73_m2} (ref >59)
GFR, EST AFRICAN AMERICAN: 94 mL/min/{1.73_m2} (ref >59)
GLOBULIN, TOTAL: 2.9 g/dL (ref 1.5–4.5)
Glucose: 105 mg/dL — ABNORMAL HIGH (ref 65–99)
POTASSIUM: 4.2 mmol/L (ref 3.5–5.2)
SODIUM: 142 mmol/L (ref 134–144)
TOTAL PROTEIN: 7.5 g/dL (ref 6.0–8.5)

## 2017-11-11 LAB — CBC WITH DIFFERENTIAL/PLATELET
Basophils Absolute: 0.1 10*3/uL (ref 0.0–0.2)
Basos: 1 %
EOS (ABSOLUTE): 0.1 10*3/uL (ref 0.0–0.4)
Eos: 2 %
Hematocrit: 48.5 % (ref 37.5–51.0)
Hemoglobin: 16.4 g/dL (ref 13.0–17.7)
Immature Grans (Abs): 0 10*3/uL (ref 0.0–0.1)
Immature Granulocytes: 0 %
LYMPHS ABS: 3.1 10*3/uL (ref 0.7–3.1)
Lymphs: 45 %
MCH: 31.4 pg (ref 26.6–33.0)
MCHC: 33.8 g/dL (ref 31.5–35.7)
MCV: 93 fL (ref 79–97)
Monocytes Absolute: 0.6 10*3/uL (ref 0.1–0.9)
Monocytes: 9 %
NEUTROS ABS: 3 10*3/uL (ref 1.4–7.0)
Neutrophils: 43 %
PLATELETS: 391 10*3/uL — AB (ref 150–379)
RBC: 5.22 x10E6/uL (ref 4.14–5.80)
RDW: 14.4 % (ref 12.3–15.4)
WBC: 6.9 10*3/uL (ref 3.4–10.8)

## 2017-11-11 LAB — CYTOLOGY, URINE

## 2017-11-11 LAB — HIV ANTIBODY (ROUTINE TESTING W REFLEX): HIV SCREEN 4TH GENERATION: NONREACTIVE

## 2017-11-11 LAB — TSH: TSH: 0.885 u[IU]/mL (ref 0.450–4.500)

## 2017-11-11 LAB — VITAMIN D 25 HYDROXY (VIT D DEFICIENCY, FRACTURES): VIT D 25 HYDROXY: 6.8 ng/mL — AB (ref 30.0–100.0)

## 2017-11-11 LAB — RPR: RPR: NONREACTIVE

## 2017-11-11 LAB — LDL CHOLESTEROL, DIRECT: LDL DIRECT: 115 mg/dL — AB (ref 0–99)

## 2017-11-18 ENCOUNTER — Other Ambulatory Visit: Payer: Self-pay | Admitting: Family Medicine

## 2017-11-18 DIAGNOSIS — F330 Major depressive disorder, recurrent, mild: Secondary | ICD-10-CM

## 2017-11-18 DIAGNOSIS — N529 Male erectile dysfunction, unspecified: Secondary | ICD-10-CM

## 2017-11-18 DIAGNOSIS — B009 Herpesviral infection, unspecified: Secondary | ICD-10-CM

## 2017-11-18 MED ORDER — MIRTAZAPINE 30 MG PO TABS: 30.0000 mg | ORAL_TABLET | Freq: Every day | ORAL | 3 refills | Status: DC

## 2017-11-18 MED ORDER — VALACYCLOVIR HCL 1 G PO TABS: 1000.0000 mg | ORAL_TABLET | Freq: Every day | ORAL | 3 refills | Status: DC

## 2017-11-18 MED ORDER — SILDENAFIL CITRATE 50 MG PO TABS: 50.0000 mg | ORAL_TABLET | ORAL | 1 refills | Status: DC | PRN

## 2017-11-18 MED ORDER — BUPROPION HCL ER (XL) 300 MG PO TB24: 300.0000 mg | ORAL_TABLET | Freq: Every day | ORAL | 3 refills | Status: DC

## 2017-11-18 MED FILL — BUPROPION HCL XL 300 MG TAB: 300 | 30 days supply | Qty: 30 | Fill #0

## 2017-11-18 MED FILL — valACYclovir HCL 1 GM TABS: 1 | 30 days supply | Qty: 30 | Fill #0

## 2017-11-18 MED FILL — MIRTAZAPINE 30 MG TABLET: 30 | 30 days supply | Qty: 30 | Fill #0

## 2017-11-18 MED FILL — SILDENAFIL CITRATE 50 MG TA: 50 | 30 days supply | Qty: 6 | Fill #0

## 2017-11-19 ENCOUNTER — Telehealth: Payer: Self-pay | Admitting: *Deleted

## 2017-11-19 ENCOUNTER — Other Ambulatory Visit: Payer: Self-pay | Admitting: Family Medicine

## 2017-11-19 DIAGNOSIS — Z7251 High risk heterosexual behavior: Secondary | ICD-10-CM

## 2017-11-19 NOTE — Telephone Encounter (Signed)
-----   Message from Wendee Beaversavid J McMullen, DO sent at 11/18/2017  1:46 PM EST ----- Regarding: Need GC/ Chlamydia screen Wrong test ordered. Patient wants to be screen for GC/Chlamydia.  He would like the test to be done outside of clinic.  Please order and notify patient.  Discussed with Molly MaduroRobert who states he will be sure to notify [facility] with the prior test was an accident and he will not be charged for it.  Thank you.  Durward Parcelavid McMullen, DO [facility], PGY-2

## 2017-11-19 NOTE — Telephone Encounter (Signed)
LMOVM for pt to come pick up requisition from [facility]. Jesse Shelton Bruna PotterBlount, CMA

## 2017-12-10 ENCOUNTER — Encounter: Payer: Self-pay | Admitting: Family Medicine

## 2017-12-11 ENCOUNTER — Other Ambulatory Visit: Payer: Self-pay | Admitting: Family Medicine

## 2017-12-11 DIAGNOSIS — Z125 Encounter for screening for malignant neoplasm of prostate: Secondary | ICD-10-CM

## 2017-12-11 DIAGNOSIS — Z111 Encounter for screening for respiratory tuberculosis: Secondary | ICD-10-CM

## 2017-12-11 DIAGNOSIS — Z7251 High risk heterosexual behavior: Secondary | ICD-10-CM

## 2017-12-11 DIAGNOSIS — Z79899 Other long term (current) drug therapy: Secondary | ICD-10-CM

## 2018-01-15 MED FILL — valACYclovir HCL 1 GM TABS: 1 | 30 days supply | Qty: 30 | Fill #1

## 2018-01-15 MED FILL — MIRTAZAPINE 30 MG TABLET: 30 | 30 days supply | Qty: 30 | Fill #1

## 2018-01-15 MED FILL — BUPROPION HCL XL 300 MG TAB: 300 | 30 days supply | Qty: 30 | Fill #1

## 2018-04-02 ENCOUNTER — Other Ambulatory Visit: Payer: Self-pay

## 2018-04-02 DIAGNOSIS — B009 Herpesviral infection, unspecified: Secondary | ICD-10-CM

## 2018-04-03 MED ORDER — VALACYCLOVIR HCL 1 G PO TABS: 1000.0000 mg | ORAL_TABLET | Freq: Every day | ORAL | 3 refills | Status: DC

## 2018-04-12 ENCOUNTER — Other Ambulatory Visit: Payer: Self-pay

## 2018-04-12 DIAGNOSIS — B009 Herpesviral infection, unspecified: Secondary | ICD-10-CM

## 2018-04-12 DIAGNOSIS — F330 Major depressive disorder, recurrent, mild: Secondary | ICD-10-CM

## 2018-04-12 DIAGNOSIS — N529 Male erectile dysfunction, unspecified: Secondary | ICD-10-CM

## 2018-04-12 MED ORDER — MIRTAZAPINE 30 MG PO TABS: 30.0000 mg | ORAL_TABLET | Freq: Every day | ORAL | 0 refills | Status: DC

## 2018-04-12 MED ORDER — VALACYCLOVIR HCL 1 G PO TABS: 1000.0000 mg | ORAL_TABLET | Freq: Every day | ORAL | 3 refills | Status: DC

## 2018-04-12 MED ORDER — SILDENAFIL CITRATE 50 MG PO TABS: 50.0000 mg | ORAL_TABLET | ORAL | 1 refills | Status: DC | PRN

## 2018-04-15 ENCOUNTER — Telehealth: Payer: Self-pay | Admitting: Family Medicine

## 2018-04-15 ENCOUNTER — Telehealth: Payer: Self-pay | Admitting: *Deleted

## 2018-04-15 NOTE — Telephone Encounter (Signed)
Medication was denied for the following reason:  Sildenafil citrate 50mg  tablet (14 tablets per 7 days) is not covered by the plan. Your request for Sildenafil citrate 50mg  (14 tablets per 7 days) exceeds the quantity limit of 6 tablets per 30 days established by your plan's prescription benefit.  Will forward to MD.   Fleeger, Maryjo RochesterJessica Dawn, CMA

## 2018-04-15 NOTE — Telephone Encounter (Signed)
  Received fax from pharmacy, PA needed on viagra.  Clinical questions submitted via Cover My Meds.  Waiting on response, could take up to 72 hours.  Cover My Meds info: Key: [id number]  Jesse Shelton, Maryjo RochesterJessica Dawn, CMA

## 2018-04-15 NOTE — Telephone Encounter (Signed)
LVM to schedule f/u appt. Please assist in doing so °

## 2018-04-17 NOTE — Telephone Encounter (Signed)
Noted. No patient covers this for ED. Please advise.  Durward Parcelavid McMullen, DO [facility], PGY-3

## 2018-05-13 ENCOUNTER — Telehealth: Payer: Self-pay

## 2018-05-13 NOTE — Telephone Encounter (Signed)
Patient left message asking if lab orders could be left/entered for him to come in and test for STIs.  Call back is [phone]  Ples SpecterAlisa Shakeem Stern, RN [facility] RN)

## 2018-05-14 NOTE — Telephone Encounter (Signed)
Attempted to call pt but no answer. Will try again later. Marlise Fahr, CMA  

## 2018-05-14 NOTE — Telephone Encounter (Signed)
Patient needs appointment.  Durward Parcelavid McMullen, DO [facility], PGY-3

## 2018-05-27 ENCOUNTER — Ambulatory Visit: Payer: Self-pay | Admitting: Family Medicine

## 2018-05-31 ENCOUNTER — Other Ambulatory Visit: Payer: Self-pay | Admitting: Family Medicine

## 2018-05-31 DIAGNOSIS — F330 Major depressive disorder, recurrent, mild: Secondary | ICD-10-CM

## 2018-08-18 ENCOUNTER — Inpatient Hospital Stay (HOSPITAL_COMMUNITY)
Admission: EM | Admit: 2018-08-18 | Discharge: 2018-08-20 | DRG: 419 | Disposition: A | Discharge status: Home / Self Care | Payer: PRIVATE HEALTH INSURANCE | Attending: Surgery | Admitting: Surgery

## 2018-08-18 ENCOUNTER — Encounter (HOSPITAL_COMMUNITY): Payer: Self-pay

## 2018-08-18 ENCOUNTER — Emergency Department (HOSPITAL_COMMUNITY): Payer: PRIVATE HEALTH INSURANCE

## 2018-08-18 ENCOUNTER — Other Ambulatory Visit: Payer: Self-pay

## 2018-08-18 DIAGNOSIS — G4733 Obstructive sleep apnea (adult) (pediatric): Secondary | ICD-10-CM | POA: Diagnosis present

## 2018-08-18 DIAGNOSIS — F17290 Nicotine dependence, other tobacco product, uncomplicated: Secondary | ICD-10-CM | POA: Diagnosis present

## 2018-08-18 DIAGNOSIS — F329 Major depressive disorder, single episode, unspecified: Secondary | ICD-10-CM | POA: Diagnosis present

## 2018-08-18 DIAGNOSIS — R7303 Prediabetes: Secondary | ICD-10-CM | POA: Diagnosis present

## 2018-08-18 DIAGNOSIS — K810 Acute cholecystitis: Secondary | ICD-10-CM | POA: Diagnosis present

## 2018-08-18 DIAGNOSIS — Z79899 Other long term (current) drug therapy: Secondary | ICD-10-CM | POA: Diagnosis not present

## 2018-08-18 DIAGNOSIS — K8000 Calculus of gallbladder with acute cholecystitis without obstruction: Principal | ICD-10-CM | POA: Diagnosis present

## 2018-08-18 DIAGNOSIS — Z6837 Body mass index (BMI) 37.0-37.9, adult: Secondary | ICD-10-CM

## 2018-08-18 DIAGNOSIS — R1011 Right upper quadrant pain: Secondary | ICD-10-CM

## 2018-08-18 HISTORY — DX: Calculus of gallbladder without cholecystitis without obstruction: K80.20

## 2018-08-18 LAB — URINALYSIS, ROUTINE W REFLEX MICROSCOPIC
BACTERIA UA: NONE SEEN
BILIRUBIN URINE: NEGATIVE
Glucose, UA: NEGATIVE mg/dL
HGB URINE DIPSTICK: NEGATIVE
KETONES UR: 20 mg/dL — AB
NITRITE: NEGATIVE
Protein, ur: NEGATIVE mg/dL
Specific Gravity, Urine: 1.028 (ref 1.005–1.030)
pH: 6 (ref 5.0–8.0)

## 2018-08-18 LAB — COMPREHENSIVE METABOLIC PANEL
ALBUMIN: 4.2 g/dL (ref 3.5–5.0)
ALK PHOS: 64 U/L (ref 38–126)
ALT: 20 U/L (ref 0–44)
ANION GAP: 10 (ref 5–15)
AST: 16 U/L (ref 15–41)
BUN: 6 mg/dL (ref 6–20)
CALCIUM: 9.5 mg/dL (ref 8.9–10.3)
CO2: 24 mmol/L (ref 22–32)
Chloride: 107 mmol/L (ref 98–111)
Creatinine, Ser: 1.04 mg/dL (ref 0.61–1.24)
GFR calc Af Amer: >60 mL/min (ref >60)
GFR calc non Af Amer: >60 mL/min (ref >60)
GLUCOSE: 140 mg/dL — AB (ref 70–99)
POTASSIUM: 3.6 mmol/L (ref 3.5–5.1)
SODIUM: 141 mmol/L (ref 135–145)
TOTAL PROTEIN: 8 g/dL (ref 6.5–8.1)
Total Bilirubin: 0.7 mg/dL (ref 0.3–1.2)

## 2018-08-18 LAB — CBC
HCT: 50.6 % (ref 39.0–52.0)
HEMOGLOBIN: 16.8 g/dL (ref 13.0–17.0)
MCH: 30.3 pg (ref 26.0–34.0)
MCHC: 33.2 g/dL (ref 30.0–36.0)
MCV: 91.3 fL (ref 80.0–100.0)
Platelets: 431 10*3/uL — ABNORMAL HIGH (ref 150–400)
RBC: 5.54 MIL/uL (ref 4.22–5.81)
RDW: 13 % (ref 11.5–15.5)
WBC: 12.8 10*3/uL — ABNORMAL HIGH (ref 4.0–10.5)
nRBC: 0 % (ref 0.0–0.2)

## 2018-08-18 LAB — SURGICAL PCR SCREEN
MRSA, PCR: NEGATIVE
STAPHYLOCOCCUS AUREUS: NEGATIVE

## 2018-08-18 LAB — LIPASE, BLOOD: Lipase: 109 U/L — ABNORMAL HIGH (ref 11–51)

## 2018-08-18 MED ORDER — DIPHENHYDRAMINE HCL 50 MG/ML IJ SOLN: 25.0000 mg | Freq: Four times a day (QID) | INTRAMUSCULAR | Status: DC | PRN

## 2018-08-18 MED ORDER — MORPHINE SULFATE (PF) 4 MG/ML IV SOLN
4.0000 mg | Freq: Once | INTRAVENOUS | Status: AC
  Administered 2018-08-18: 4 mg via INTRAVENOUS
  Filled 2018-08-18: qty 1

## 2018-08-18 MED ORDER — ONDANSETRON 4 MG PO TBDP: 4.0000 mg | ORAL_TABLET | Freq: Four times a day (QID) | ORAL | Status: DC | PRN

## 2018-08-18 MED ORDER — MORPHINE SULFATE (PF) 2 MG/ML IV SOLN
2.0000 mg | INTRAVENOUS | Status: DC | PRN
  Administered 2018-08-19: 2 mg via INTRAVENOUS
  Filled 2018-08-18: qty 1
  Filled 2018-08-18: qty 2

## 2018-08-18 MED ORDER — MIRTAZAPINE 15 MG PO TABS
30.0000 mg | ORAL_TABLET | Freq: Every day | ORAL | Status: DC
  Administered 2018-08-18: 30 mg via ORAL
  Filled 2018-08-18: qty 1
  Filled 2018-08-18: qty 2

## 2018-08-18 MED ORDER — DIPHENHYDRAMINE HCL 25 MG PO CAPS: 25.0000 mg | ORAL_CAPSULE | Freq: Four times a day (QID) | ORAL | Status: DC | PRN

## 2018-08-18 MED ORDER — POTASSIUM CHLORIDE IN NACL 20-0.9 MEQ/L-% IV SOLN
INTRAVENOUS | Status: DC
  Administered 2018-08-18 – 2018-08-19 (×2): via INTRAVENOUS
  Filled 2018-08-18 (×2): qty 1000

## 2018-08-18 MED ORDER — HYDRALAZINE HCL 20 MG/ML IJ SOLN: 10.0000 mg | INTRAMUSCULAR | Status: DC | PRN

## 2018-08-18 MED ORDER — VALACYCLOVIR HCL 500 MG PO TABS
1000.0000 mg | ORAL_TABLET | Freq: Every day | ORAL | Status: DC
  Administered 2018-08-18: 1000 mg via ORAL
  Filled 2018-08-18 (×2): qty 2

## 2018-08-18 MED ORDER — ACETAMINOPHEN 500 MG PO TABS
1000.0000 mg | ORAL_TABLET | Freq: Three times a day (TID) | ORAL | Status: DC | PRN
  Administered 2018-08-18 (×2): 1000 mg via ORAL
  Filled 2018-08-18 (×2): qty 2

## 2018-08-18 MED ORDER — ENOXAPARIN SODIUM 60 MG/0.6ML ~~LOC~~ SOLN
0.5000 mg/kg | SUBCUTANEOUS | Status: DC
  Administered 2018-08-18: 55 mg via SUBCUTANEOUS
  Filled 2018-08-18 (×2): qty 0.6

## 2018-08-18 MED ORDER — BUPROPION HCL ER (XL) 300 MG PO TB24
300.0000 mg | ORAL_TABLET | Freq: Every day | ORAL | Status: DC
  Administered 2018-08-18: 300 mg via ORAL
  Filled 2018-08-18: qty 2

## 2018-08-18 MED ORDER — METRONIDAZOLE IN NACL 5-0.79 MG/ML-% IV SOLN
500.0000 mg | Freq: Three times a day (TID) | INTRAVENOUS | Status: DC
  Administered 2018-08-18 – 2018-08-19 (×4): 500 mg via INTRAVENOUS
  Filled 2018-08-18 (×4): qty 100

## 2018-08-18 MED ORDER — OXYCODONE HCL 5 MG PO TABS
5.0000 mg | ORAL_TABLET | ORAL | Status: DC | PRN
  Administered 2018-08-18: 5 mg via ORAL
  Filled 2018-08-18: qty 1

## 2018-08-18 MED ORDER — SODIUM CHLORIDE (PF) 0.9 % IJ SOLN
INTRAMUSCULAR | Status: AC
  Administered 2018-08-18: 11:00:00
  Filled 2018-08-18: qty 50

## 2018-08-18 MED ORDER — ONDANSETRON HCL 4 MG/2ML IJ SOLN: 4.0000 mg | Freq: Four times a day (QID) | INTRAMUSCULAR | Status: DC | PRN

## 2018-08-18 MED ORDER — ONDANSETRON HCL 4 MG/2ML IJ SOLN
4.0000 mg | Freq: Once | INTRAMUSCULAR | Status: AC
  Administered 2018-08-18: 4 mg via INTRAVENOUS
  Filled 2018-08-18: qty 2

## 2018-08-18 MED ORDER — SODIUM CHLORIDE 0.9 % IV BOLUS
1000.0000 mL | Freq: Once | INTRAVENOUS | Status: AC
  Administered 2018-08-18: 1000 mL via INTRAVENOUS

## 2018-08-18 MED ORDER — IOPAMIDOL (ISOVUE-300) INJECTION 61%
100.0000 mL | Freq: Once | INTRAVENOUS | Status: AC | PRN
  Administered 2018-08-18: 100 mL via INTRAVENOUS

## 2018-08-18 MED ORDER — DOCUSATE SODIUM 100 MG PO CAPS
100.0000 mg | ORAL_CAPSULE | Freq: Two times a day (BID) | ORAL | Status: DC
  Administered 2018-08-18: 100 mg via ORAL
  Filled 2018-08-18: qty 1

## 2018-08-18 MED ORDER — FAMOTIDINE 20 MG PO TABS
20.0000 mg | ORAL_TABLET | Freq: Every day | ORAL | Status: DC
  Administered 2018-08-18: 20 mg via ORAL
  Filled 2018-08-18: qty 1

## 2018-08-18 MED ORDER — CIPROFLOXACIN IN D5W 400 MG/200ML IV SOLN
400.0000 mg | Freq: Two times a day (BID) | INTRAVENOUS | Status: DC
  Administered 2018-08-18 – 2018-08-19 (×3): 400 mg via INTRAVENOUS
  Filled 2018-08-18 (×3): qty 200

## 2018-08-18 MED ORDER — IOPAMIDOL (ISOVUE-300) INJECTION 61%
INTRAVENOUS | Status: AC
  Administered 2018-08-18: 11:00:00
  Filled 2018-08-18: qty 100

## 2018-08-18 NOTE — ED Notes (Signed)
US at bedside

## 2018-08-18 NOTE — ED Notes (Signed)
Pt back from CT ambulating to restroom

## 2018-08-18 NOTE — ED Notes (Signed)
Pt ambulatory to restroom

## 2018-08-18 NOTE — ED Notes (Signed)
ED TO INPATIENT HANDOFF REPORT  Name/Age/Gender Jesse Shelton 37 y.o. male  Code Status    Code Status Orders  (From admission, onward)         Start     Ordered   08/18/18 1329  Full code  Continuous     08/18/18 1332        Code Status History    This patient has a current code status but no historical code status.      Home/SNF/Other Home  Chief Complaint abd pain  Level of Care/Admitting Diagnosis ED Disposition    ED Disposition Condition Comment   Admit  Hospital Area: [facility] [[id number]]  Level of Care: Med-Surg [16]  Diagnosis: Acute cholecystitis [575.0.ICD-9-CM]  Admitting Physician: [facility], [place]  Attending Physician: [facility], MD [[id number]]  Estimated length of stay: past midnight tomorrow  Certification:: I certify this patient will need inpatient services for at least 2 midnights  Bed request comments: 5W  PT Class (Do Not Modify): Inpatient [101]  PT Acc Code (Do Not Modify): Private [1]       Medical History Past Medical History:  Diagnosis Date  . Anxiety   . Depression   . Herpes   . Insomnia   . Sleep apnea     Allergies Allergies  Allergen Reactions  . Penicillins Other (See Comments)    Has patient had a PCN reaction causing immediate rash, facial/tongue/throat swelling, SOB or lightheadedness with hypotension: Unknown Has patient had a PCN reaction causing severe rash involving mucus membranes or skin necrosis: Unknown Has patient had a PCN reaction that required hospitalization: No Has patient had a PCN reaction occurring within the last 10 years: No If all of the above answers are "NO", then may proceed with Cephalosporin use.    IV Location/Drains/Wounds Patient Lines/Drains/Airways Status   Active Line/Drains/Airways    Name:   Placement date:   Placement time:   Site:   Days:   Peripheral IV 08/18/18 Left Forearm   08/18/18    0718    Forearm   less than 1          Labs/Imaging Results for  orders placed or performed during the hospital encounter of 08/18/18 (from the past 48 hour(s))  Lipase, blood     Status: Abnormal   Collection Time: 08/18/18  7:07 AM  Result Value Ref Range   Lipase 109 (H) 11 - 51 U/L    Comment: Performed at [facility], [place] [address], [place], [place] [address]  Comprehensive metabolic panel     Status: Abnormal   Collection Time: 08/18/18  7:07 AM  Result Value Ref Range   Sodium 141 135 - 145 mmol/L   Potassium 3.6 3.5 - 5.1 mmol/L   Chloride 107 98 - 111 mmol/L   CO2 24 22 - 32 mmol/L   Glucose, Bld 140 (H) 70 - 99 mg/dL   BUN 6 6 - 20 mg/dL   Creatinine, Ser 1.04 0.61 - 1.24 mg/dL   Calcium 9.5 8.9 - 10.3 mg/dL   Total Protein 8.0 6.5 - 8.1 g/dL   Albumin 4.2 3.5 - 5.0 g/dL   AST 16 15 - 41 U/L   ALT 20 0 - 44 U/L   Alkaline Phosphatase 64 38 - 126 U/L   Total Bilirubin 0.7 0.3 - 1.2 mg/dL   GFR calc non Af Amer >60 >60 mL/min   GFR calc Af Amer >60 >60 mL/min    Comment: (  NOTE) The eGFR has been calculated using the CKD EPI equation. This calculation has not been validated in all clinical situations. eGFR's persistently <60 mL/min signify possible Chronic Kidney Disease.    Anion gap 10 5 - 15    Comment: Performed at [facility], [place] [address] [place], [place] [address]  CBC     Status: Abnormal   Collection Time: 08/18/18  7:07 AM  Result Value Ref Range   WBC 12.8 (H) 4.0 - 10.5 K/uL   RBC 5.54 4.22 - 5.81 MIL/uL   Hemoglobin 16.8 13.0 - 17.0 g/dL   HCT 50.6 39.0 - 52.0 %   MCV 91.3 80.0 - 100.0 fL   MCH 30.3 26.0 - 34.0 pg   MCHC 33.2 30.0 - 36.0 g/dL   RDW 13.0 11.5 - 15.5 %   Platelets 431 (H) 150 - 400 K/uL   nRBC 0.0 0.0 - 0.2 %    Comment: Performed at [facility], [place] [address], [place], [place] [address]  Urinalysis, Routine w reflex microscopic     Status: Abnormal   Collection Time: 08/18/18  8:45 AM  Result Value Ref Range   Color, Urine YELLOW  YELLOW   APPearance CLEAR CLEAR   Specific Gravity, Urine 1.028 1.005 - 1.030   pH 6.0 5.0 - 8.0   Glucose, UA NEGATIVE NEGATIVE mg/dL   Hgb urine dipstick NEGATIVE NEGATIVE   Bilirubin Urine NEGATIVE NEGATIVE   Ketones, ur 20 (A) NEGATIVE mg/dL   Protein, ur NEGATIVE NEGATIVE mg/dL   Nitrite NEGATIVE NEGATIVE   Leukocytes, UA TRACE (A) NEGATIVE   RBC / HPF 0-5 0 - 5 RBC/hpf   WBC, UA 0-5 0 - 5 WBC/hpf   Bacteria, UA NONE SEEN NONE SEEN   Squamous Epithelial / LPF 0-5 0 - 5   Mucus PRESENT     Comment: Performed at [facility], [place] [address] [place], [place] [address]   Ct Abdomen Pelvis W Contrast  Result Date: 08/18/2018 CLINICAL DATA:  Lower abdominal pain with diarrhea and vomiting EXAM: CT ABDOMEN AND PELVIS WITH CONTRAST TECHNIQUE: Multidetector CT imaging of the abdomen and pelvis was performed using the standard protocol following bolus administration of intravenous contrast. CONTRAST:  171m ISOVUE-300 IOPAMIDOL (ISOVUE-300) INJECTION 61% COMPARISON:  None. FINDINGS: Lower chest: Lung bases are clear. Hepatobiliary: No focal liver lesions are evident. There are multiple gallstones and sludge within the gallbladder. There are areas of apparent gallbladder wall thickening with a questionable area of pericholecystic fluid. There is no biliary duct dilatation. Pancreas: No pancreatic mass or inflammatory focus no. Spleen: No splenic lesions are evident. Adrenals/Urinary Tract: Adrenals bilaterally appear unremarkable. Kidneys bilaterally show no evident mass or hydronephrosis on either side. There is no renal or ureteral calculus on either side. Urinary bladder is midline with wall thickness within normal limits. Stomach/Bowel: There is no appreciable bowel wall or mesenteric thickening. There is no evident bowel obstruction. There is no free air or portal venous air. Vascular/Lymphatic: There is no abdominal aortic aneurysm. No vascular lesions are evident. No  adenopathy is evident in the abdomen or pelvis. Reproductive: Prostate and seminal vesicles are normal in size and contour. No pelvic mass evident. Other: The appendix appears unremarkable. No periappendiceal region inflammation. There is no abscess or ascites in the abdomen or pelvis. There is mild fat in the right inguinal ring. Musculoskeletal: There are no blastic or lytic bone lesions. There is no intramuscular or abdominal wall lesion evident. IMPRESSION:  1. Cholelithiasis with apparent degree of gallbladder wall thickening and questionable pericholecystic fluid. Appearance of the gallbladder raises concern for acute cholecystitis. Correlation with gallbladder ultrasound may be advisable in this regard. 2. Appendix region appears normal. No bowel obstruction. No abscess in the abdomen or pelvis. 3.  No evident renal or ureteral calculus.  No hydronephrosis. Electronically Signed   By: Lowella Grip III M.D.   On: 08/18/2018 08:53   US Abdomen Limited Ruq  Result Date: 08/18/2018 CLINICAL DATA:  Upper abdominal pain EXAM: ULTRASOUND ABDOMEN LIMITED RIGHT UPPER QUADRANT COMPARISON:  CT abdomen and pelvis August 18, 2018 FINDINGS: Gallbladder: There is a combination of sludge and cholelithiasis within the gallbladder. The largest individual gallstone measures 6 mm in length. There is gallbladder wall thickening and edema with trace pericholecystic fluid. No sonographic Murphy sign noted by sonographer. Common bile duct: Diameter: 5 mm. No intrahepatic or extrahepatic biliary duct dilatation. Liver: No focal lesion identified. Within normal limits in parenchymal echogenicity. Portal vein is patent on color Doppler imaging with normal direction of blood flow towards the liver. There is trace ascites adjacent to the liver. IMPRESSION: Cholelithiasis and sludge within the gallbladder. Gallbladder wall is thickened and edematous with mild pericholecystic fluid. These are findings indicative of acute  cholecystitis. Trace ascites adjacent to the liver noted. Electronically Signed   By: Lowella Grip III M.D.   On: 08/18/2018 11:40    Pending Labs Unresulted Labs (From admission, onward)    Start     Ordered   08/25/18 0500  Creatinine, serum  (enoxaparin (LOVENOX)    CrCl >/= 30 ml/min)  Weekly,   R    Comments:  while on enoxaparin therapy    08/18/18 1332   08/19/18 0500  Comprehensive metabolic panel  Tomorrow morning,   R     08/18/18 1332   08/19/18 0500  CBC  Tomorrow morning,   R     08/18/18 1332   08/18/18 1329  CBC  (enoxaparin (LOVENOX)    CrCl >/= 30 ml/min)  Once,   R    Comments:  Baseline for enoxaparin therapy IF NOT ALREADY DRAWN.  Notify MD if PLT < 100 K.    08/18/18 1332   08/18/18 1329  Creatinine, serum  (enoxaparin (LOVENOX)    CrCl >/= 30 ml/min)  Once,   R    Comments:  Baseline for enoxaparin therapy IF NOT ALREADY DRAWN.    08/18/18 1332          Vitals/Pain Today's Vitals   08/18/18 1200 08/18/18 1230 08/18/18 1300 08/18/18 1330  BP: (!) 151/97 (!) 152/92 (!) 149/89 (!) 158/106  Pulse: 67 (!) 56 (!) 53 (!) 57  Resp: 16 16 16 16   Temp:      TempSrc:      SpO2: 100% 99% 100% 99%  Weight:      Height:      PainSc:        Isolation Precautions No active isolations  Medications Medications  enoxaparin (LOVENOX) injection 40 mg (has no administration in time range)  0.9 % NaCl with KCl 20 mEq/ L  infusion (has no administration in time range)  ciprofloxacin (CIPRO) IVPB 400 mg (has no administration in time range)    And  metroNIDAZOLE (FLAGYL) IVPB 500 mg (has no administration in time range)  acetaminophen (TYLENOL) tablet 1,000 mg (has no administration in time range)  oxyCODONE (Oxy IR/ROXICODONE) immediate release tablet 5-10 mg (has no administration in time range)  morphine 2 MG/ML injection 2-4 mg (has no administration in time range)  diphenhydrAMINE (BENADRYL) capsule 25 mg (has no administration in time range)    Or   diphenhydrAMINE (BENADRYL) injection 25 mg (has no administration in time range)  ondansetron (ZOFRAN-ODT) disintegrating tablet 4 mg (has no administration in time range)    Or  ondansetron (ZOFRAN) injection 4 mg (has no administration in time range)  docusate sodium (COLACE) capsule 100 mg (has no administration in time range)  hydrALAZINE (APRESOLINE) injection 10 mg (has no administration in time range)  famotidine (PEPCID) tablet 20 mg (has no administration in time range)  iopamidol (ISOVUE-300) 61 % injection 100 mL (100 mLs Intravenous Contrast Given 08/18/18 0830)  iopamidol (ISOVUE-300) 61 % injection (  Contrast Given 08/18/18 1035)  sodium chloride (PF) 0.9 % injection (  Given by Other 08/18/18 1035)  morphine 4 MG/ML injection 4 mg (4 mg Intravenous Given 08/18/18 0941)  ondansetron (ZOFRAN) injection 4 mg (4 mg Intravenous Given 08/18/18 0941)  sodium chloride 0.9 % bolus 1,000 mL ( Intravenous Stopped 08/18/18 1043)    Mobility walks

## 2018-08-18 NOTE — ED Triage Notes (Signed)
Pt reports lower abdominal pain. He states the pain goes in a horizontal line under his umbilicus. He reports a pervious repaired hernia above this area. He states that he is not sure what makes the pain better or worse, but that the pain comes and goes. Endorses diarrhea and vomiting on Monday. A&Ox4. Ambulatory.

## 2018-08-18 NOTE — H&P (Signed)
[facility] Surgery Consult/Admission Note  Jesse Shelton 02-22-1981  [id number].    Requesting MD: Dr. Lita Mains Chief Complaint/Reason for Consult: acute cholecystitis  HPI:  Pt is a 37 yo male with a hx of OSA on CPAP, pre-diabetic, obesity, depression and anxiety who presented to the ED with complaints of abdominal pain. Pt states pain started on Monday in the mid abdomen and progressively worsened to severe, constant, sharp, non radiating, not affected by food. Associated nausea and vomiting. No fever, chill, CP, SOB. No other associated symptoms. Spoke to wife over the phone. Previous abdominal surgeries are a ventral hernia repair. No anticoagulation.  [place]: Cholelithiasis and sludge within the gallbladder. Gallbladder wall is thickened and edematous with mild pericholecystic fluid. These are findings indicative of acute cholecystitis Labs: WBC 12.8, Lipase 109, Glucose 140  ROS:  Review of Systems  Constitutional: Negative for chills, diaphoresis and fever.  HENT: Negative for sore throat.   Respiratory: Negative for cough and shortness of breath.   Cardiovascular: Negative for chest pain.  Gastrointestinal: Positive for abdominal pain, nausea and vomiting. Negative for blood in stool, constipation and diarrhea.  Genitourinary: Negative for dysuria.  Skin: Negative for rash.  Neurological: Negative for dizziness and loss of consciousness.  All other systems reviewed and are negative.    Family History  Problem Relation Age of Onset  . Cancer Maternal Grandmother        pancreatic cancer  . Depression Maternal Grandmother   . Diabetes Maternal Grandmother   . Depression Maternal Aunt   . Diabetes Maternal Aunt     Past Medical History:  Diagnosis Date  . Anxiety   . Depression   . Herpes   . Insomnia   . Sleep apnea     Past Surgical History:  Procedure Laterality Date  . BONE MARROW HARVEST  09/2011  . ELBOW SURGERY  1996   RIGHT  . HERNIA REPAIR  N/A 2002   [facility]    Social History:  reports that he has been smoking e-cigarettes. He has a 0.50 pack-year smoking history. He has never used smokeless tobacco. He reports that he drinks about 2.0 standard drinks of alcohol per week. He reports that he does not use drugs.  Allergies:  Allergies  Allergen Reactions  . Penicillins Other (See Comments)    Has patient had a PCN reaction causing immediate rash, facial/tongue/throat swelling, SOB or lightheadedness with hypotension: Unknown Has patient had a PCN reaction causing severe rash involving mucus membranes or skin necrosis: Unknown Has patient had a PCN reaction that required hospitalization: No Has patient had a PCN reaction occurring within the last 10 years: No If all of the above answers are "NO", then may proceed with Cephalosporin use.     (Not in a hospital admission)  Blood pressure (!) 149/89, pulse (!) 53, temperature 98.8 F (37.1 C), temperature source Oral, resp. rate 16, height 5' 8"  (1.727 m), weight 112 kg, SpO2 100 %.  Physical Exam  Constitutional: He is oriented to person, place, and time. He appears well-developed and well-nourished.  Non-toxic appearance. He does not appear ill. No distress.  HENT:  Head: Normocephalic and atraumatic.  Nose: Nose normal.  Mouth/Throat: Uvula is midline, oropharynx is clear and moist and mucous membranes are normal. No oropharyngeal exudate.  Eyes: Pupils are equal, round, and reactive to light. Conjunctivae are normal. Right eye exhibits no discharge. Left eye exhibits no discharge. No scleral icterus.  Neck: Normal range of motion. Neck supple. No  thyromegaly present.  Cardiovascular: Normal rate, regular rhythm, normal heart sounds and intact distal pulses.  No murmur heard. Pulses:      Radial pulses are 2+ on the right side, and 2+ on the left side.       Posterior tibial pulses are 2+ on the right side, and 2+ on the left side.  Pulmonary/Chest: Effort normal and  breath sounds normal. No respiratory distress. He has no wheezes. He has no rhonchi. He has no rales.  Abdominal: Soft. Normal appearance and bowel sounds are normal. He exhibits no distension. There is no hepatosplenomegaly. There is tenderness in the right upper quadrant and right lower quadrant. There is no rigidity and no guarding.    Previous well healed midline scar from hernia repair  Musculoskeletal: Normal range of motion. He exhibits no edema, tenderness or deformity.  Lymphadenopathy:    He has no cervical adenopathy.  Neurological: He is alert and oriented to person, place, and time.  Skin: Skin is warm and dry. No rash noted. He is not diaphoretic.  Psychiatric: He has a normal mood and affect.  Nursing note and vitals reviewed.   Results for orders placed or performed during the hospital encounter of 08/18/18 (from the past 48 hour(s))  Lipase, blood     Status: Abnormal   Collection Time: 08/18/18  7:07 AM  Result Value Ref Range   Lipase 109 (H) 11 - 51 U/L    Comment: Performed at [facility], [place] [address], [place], [place] [address]  Comprehensive metabolic panel     Status: Abnormal   Collection Time: 08/18/18  7:07 AM  Result Value Ref Range   Sodium 141 135 - 145 mmol/L   Potassium 3.6 3.5 - 5.1 mmol/L   Chloride 107 98 - 111 mmol/L   CO2 24 22 - 32 mmol/L   Glucose, Bld 140 (H) 70 - 99 mg/dL   BUN 6 6 - 20 mg/dL   Creatinine, Ser 1.04 0.61 - 1.24 mg/dL   Calcium 9.5 8.9 - 10.3 mg/dL   Total Protein 8.0 6.5 - 8.1 g/dL   Albumin 4.2 3.5 - 5.0 g/dL   AST 16 15 - 41 U/L   ALT 20 0 - 44 U/L   Alkaline Phosphatase 64 38 - 126 U/L   Total Bilirubin 0.7 0.3 - 1.2 mg/dL   GFR calc non Af Amer >60 >60 mL/min   GFR calc Af Amer >60 >60 mL/min    Comment: (NOTE) The eGFR has been calculated using the CKD EPI equation. This calculation has not been validated in all clinical situations. eGFR's persistently <60 mL/min signify possible Chronic  Kidney Disease.    Anion gap 10 5 - 15    Comment: Performed at [facility], [place] [address] [place], [place] [address]  CBC     Status: Abnormal   Collection Time: 08/18/18  7:07 AM  Result Value Ref Range   WBC 12.8 (H) 4.0 - 10.5 K/uL   RBC 5.54 4.22 - 5.81 MIL/uL   Hemoglobin 16.8 13.0 - 17.0 g/dL   HCT 50.6 39.0 - 52.0 %   MCV 91.3 80.0 - 100.0 fL   MCH 30.3 26.0 - 34.0 pg   MCHC 33.2 30.0 - 36.0 g/dL   RDW 13.0 11.5 - 15.5 %   Platelets 431 (H) 150 - 400 K/uL   nRBC 0.0 0.0 - 0.2 %    Comment: Performed at [facility], [place] [address] [place], [place] [address]  Urinalysis, Routine w reflex microscopic     Status: Abnormal   Collection Time: 08/18/18  8:45 AM  Result Value Ref Range   Color, Urine YELLOW YELLOW   APPearance CLEAR CLEAR   Specific Gravity, Urine 1.028 1.005 - 1.030   pH 6.0 5.0 - 8.0   Glucose, UA NEGATIVE NEGATIVE mg/dL   Hgb urine dipstick NEGATIVE NEGATIVE   Bilirubin Urine NEGATIVE NEGATIVE   Ketones, ur 20 (A) NEGATIVE mg/dL   Protein, ur NEGATIVE NEGATIVE mg/dL   Nitrite NEGATIVE NEGATIVE   Leukocytes, UA TRACE (A) NEGATIVE   RBC / HPF 0-5 0 - 5 RBC/hpf   WBC, UA 0-5 0 - 5 WBC/hpf   Bacteria, UA NONE SEEN NONE SEEN   Squamous Epithelial / LPF 0-5 0 - 5   Mucus PRESENT     Comment: Performed at [facility], [place] [address] [place], [place] [address]   Ct Abdomen Pelvis W Contrast  Result Date: 08/18/2018 CLINICAL DATA:  Lower abdominal pain with diarrhea and vomiting EXAM: CT ABDOMEN AND PELVIS WITH CONTRAST TECHNIQUE: Multidetector CT imaging of the abdomen and pelvis was performed using the standard protocol following bolus administration of intravenous contrast. CONTRAST:  128m ISOVUE-300 IOPAMIDOL (ISOVUE-300) INJECTION 61% COMPARISON:  None. FINDINGS: Lower chest: Lung bases are clear. Hepatobiliary: No focal liver lesions are evident. There are multiple gallstones and sludge within  the gallbladder. There are areas of apparent gallbladder wall thickening with a questionable area of pericholecystic fluid. There is no biliary duct dilatation. Pancreas: No pancreatic mass or inflammatory focus no. Spleen: No splenic lesions are evident. Adrenals/Urinary Tract: Adrenals bilaterally appear unremarkable. Kidneys bilaterally show no evident mass or hydronephrosis on either side. There is no renal or ureteral calculus on either side. Urinary bladder is midline with wall thickness within normal limits. Stomach/Bowel: There is no appreciable bowel wall or mesenteric thickening. There is no evident bowel obstruction. There is no free air or portal venous air. Vascular/Lymphatic: There is no abdominal aortic aneurysm. No vascular lesions are evident. No adenopathy is evident in the abdomen or pelvis. Reproductive: Prostate and seminal vesicles are normal in size and contour. No pelvic mass evident. Other: The appendix appears unremarkable. No periappendiceal region inflammation. There is no abscess or ascites in the abdomen or pelvis. There is mild fat in the right inguinal ring. Musculoskeletal: There are no blastic or lytic bone lesions. There is no intramuscular or abdominal wall lesion evident. IMPRESSION: 1. Cholelithiasis with apparent degree of gallbladder wall thickening and questionable pericholecystic fluid. Appearance of the gallbladder raises concern for acute cholecystitis. Correlation with gallbladder ultrasound may be advisable in this regard. 2. Appendix region appears normal. No bowel obstruction. No abscess in the abdomen or pelvis. 3.  No evident renal or ureteral calculus.  No hydronephrosis. Electronically Signed   By: WLowella GripIII M.D.   On: 08/18/2018 08:53   UKoreaAbdomen Limited Ruq  Result Date: 08/18/2018 CLINICAL DATA:  Upper abdominal pain EXAM: ULTRASOUND ABDOMEN LIMITED RIGHT UPPER QUADRANT COMPARISON:  CT abdomen and pelvis August 18, 2018 FINDINGS: Gallbladder:  There is a combination of sludge and cholelithiasis within the gallbladder. The largest individual gallstone measures 6 mm in length. There is gallbladder wall thickening and edema with trace pericholecystic fluid. No sonographic Murphy sign noted by sonographer. Common bile duct: Diameter: 5 mm. No intrahepatic or extrahepatic biliary duct dilatation. Liver: No focal lesion identified. Within normal limits in parenchymal echogenicity. Portal vein is  patent on color Doppler imaging with normal direction of blood flow towards the liver. There is trace ascites adjacent to the liver. IMPRESSION: Cholelithiasis and sludge within the gallbladder. Gallbladder wall is thickened and edematous with mild pericholecystic fluid. These are findings indicative of acute cholecystitis. Trace ascites adjacent to the liver noted. Electronically Signed   By: Lowella Grip III M.D.   On: 08/18/2018 11:40      Assessment/Plan Active Problems:   Acute cholecystitis  OSA - CPAP qhs Depression - home meds  Acute cholecystitis - admit to [facility] - OR tomorrow  FEN: CLD, NPO at midnight VTE: SCD's, lovenox ID: Cipro & Flagyl 11/20>> pt has a PCN allergy Foley: none Follow up: TBD  Plan: OR tomorrow for lap chole   Kalman Drape, [facility] Surgery 08/18/2018, 1:27 PM Pager: [phone] Consults: [phone] Mon-Fri 7:00 am-4:30 pm Sat-Sun 7:00 am-11:30 am

## 2018-08-18 NOTE — ED Notes (Signed)
Attempted to call report, Corrie DandyMary, primary nurse, is going to call back when she completes resetting a PCA pump.

## 2018-08-18 NOTE — ED Provider Notes (Signed)
[facility]-EMERGENCY DEPT Provider Note   CSN: [id number] Arrival date & time: 08/18/18  9629     History   Chief Complaint Chief Complaint  Patient presents with  . Abdominal Pain    HPI Jesse Shelton is a 37 y.o. male.  HPI  Patient presents with 2 days of abdominal pain.  He had several bouts of loose stool and vomiting initially.  Denies any grossly bloody or bilious vomit.  Denies any fever or chills.  Pain is worse in the right lower abdomen.  States he feels better when he is lying on his left side.  No previously similar symptoms.  No known sick contacts.  No fever or chills.  Past Medical History:  Diagnosis Date  . Anxiety   . Depression   . Herpes   . Insomnia   . Sleep apnea     Patient Active Problem List   Diagnosis Date Noted  . Acute cholecystitis 08/18/2018  . High risk sexual behavior 11/10/2017  . Depression, major, recurrent, moderate (HCC) 07/10/2016    Class: Chronic  . Pre-diabetes 10/25/2015  . OSA (obstructive sleep apnea) 02/14/2015  . Genital herpes 10/18/2014  . 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 Morbid obesity (HCC) 10/18/2014    Past Surgical History:  Procedure Laterality Date  . BONE MARROW HARVEST  09/2011  . ELBOW SURGERY  1996   RIGHT  . HERNIA REPAIR N/A 2002   Ventral        Home Medications    Prior to Admission medications   Medication Sig Start Date End Date Taking? Authorizing Provider  buPROPion (WELLBUTRIN XL) 300 MG 24 hr tablet Take 1 tablet (300 mg total) by mouth daily. 11/18/17  Yes Wendee Beavers, DO  mirtazapine (REMERON) 30 MG tablet TAKE 1 TABLET BY MOUTH AT  BEDTIME Patient taking differently: Take 30 mg by mouth at bedtime.  06/01/18  Yes Wendee Beavers, DO  sildenafil (VIAGRA) 50 MG tablet Take 1 tablet (50 mg total) by mouth as needed for erectile dysfunction. Reported on 10/24/2015 04/12/18  Yes Wendee Beavers, DO  valACYclovir (VALTREX) 1000  MG tablet Take 1 tablet (1,000 mg total) by mouth daily. 04/12/18  Yes Wendee Beavers, DO    Family History Family History  Problem Relation Age of Onset  . Cancer Maternal Grandmother        pancreatic cancer  . Depression Maternal Grandmother   . Diabetes Maternal Grandmother   . Depression Maternal Aunt   . Diabetes Maternal Aunt     Social History Social History   Tobacco Use  . Smoking status: Current Every Day Smoker    Packs/day: 0.10    Years: 5.00    Pack years: 0.50    Types: E-cigarettes  . Smokeless tobacco: Never Used  . Tobacco comment:  vapes  Substance Use Topics  . Alcohol use: Yes    Alcohol/week: 2.0 standard drinks    Types: 1 Cans of beer, 1 Shots of liquor per week  . Drug use: No     Allergies   Penicillins   Review of Systems Review of Systems  Constitutional: Negative for chills and fever.  Respiratory: Negative for cough and shortness of breath.   Cardiovascular: Negative for chest pain.  Gastrointestinal: Positive for abdominal pain, diarrhea, nausea and vomiting. Negative for blood in stool and constipation.  Musculoskeletal: Negative for back pain, myalgias and neck pain.  Skin: Negative for rash.  Neurological:  Negative for dizziness, weakness, light-headedness, numbness and headaches.  All other systems reviewed and are negative.    Physical Exam Updated Vital Signs BP (!) 162/102   Pulse 65   Temp 98.8 F (37.1 C) (Oral)   Resp 16   Ht 5\' 8"  (1.727 m)   Wt 112 kg   SpO2 98%   BMI 37.56 kg/m   Physical Exam  Constitutional: He is oriented to person, place, and time. He appears well-developed and well-nourished. No distress.  HENT:  Head: Normocephalic and atraumatic.  Mouth/Throat: Oropharynx is clear and moist.  Eyes: Pupils are equal, round, and reactive to light. EOM are normal.  Neck: Normal range of motion. Neck supple. No JVD present.  Cardiovascular: Normal rate and regular rhythm.  Pulmonary/Chest: Effort  normal and breath sounds normal.  Abdominal: Soft. Bowel sounds are normal. There is tenderness. There is no rebound and no guarding.  Mild diffuse abdominal tenderness to palpation worse in the right lower quadrant.  Questional guarding present.  No rebound.  Musculoskeletal: Normal range of motion. He exhibits no edema or tenderness.  No midline thoracic or lumbar tenderness.  No CVA tenderness.  Lymphadenopathy:    He has no cervical adenopathy.  Neurological: He is alert and oriented to person, place, and time.  Skin: Skin is warm and dry. No rash noted. He is not diaphoretic. No erythema.  Psychiatric: He has a normal mood and affect. His behavior is normal.  Nursing note and vitals reviewed.    ED Treatments / Results  Labs (all labs ordered are listed, but only abnormal results are displayed) Labs Reviewed  LIPASE, BLOOD - Abnormal; Notable for the following components:      Result Value   Lipase 109 (*)    All other components within normal limits  COMPREHENSIVE METABOLIC PANEL - Abnormal; Notable for the following components:   Glucose, Bld 140 (*)    All other components within normal limits  CBC - Abnormal; Notable for the following components:   WBC 12.8 (*)    Platelets 431 (*)    All other components within normal limits  URINALYSIS, ROUTINE W REFLEX MICROSCOPIC - Abnormal; Notable for the following components:   Ketones, ur 20 (*)    Leukocytes, UA TRACE (*)    All other components within normal limits    EKG None  Radiology Ct Abdomen Pelvis W Contrast  Result Date: 08/18/2018 CLINICAL DATA:  Lower abdominal pain with diarrhea and vomiting EXAM: CT ABDOMEN AND PELVIS WITH CONTRAST TECHNIQUE: Multidetector CT imaging of the abdomen and pelvis was performed using the standard protocol following bolus administration of intravenous contrast. CONTRAST:  ISOVUE-300 IOPAMIDOL (ISOVUE-300) INJECTION 61% COMPARISON:  None. FINDINGS: Lower chest: Lung bases are  clear. Hepatobiliary: No focal liver lesions are evident. There are multiple gallstones and sludge within the gallbladder. There are areas of apparent gallbladder wall thickening with a questionable area of pericholecystic fluid. There is no biliary duct dilatation. Pancreas: No pancreatic mass or inflammatory focus no. Spleen: No splenic lesions are evident. Adrenals/Urinary Tract: Adrenals bilaterally appear unremarkable. Kidneys bilaterally show no evident mass or hydronephrosis on either side. There is no renal or ureteral calculus on either side. Urinary bladder is midline with wall thickness within normal limits. Stomach/Bowel: There is no appreciable bowel wall or mesenteric thickening. There is no evident bowel obstruction. There is no free air or portal venous air. Vascular/Lymphatic: There is no abdominal aortic aneurysm. No vascular lesions are evident. No adenopathy is  evident in the abdomen or pelvis. Reproductive: Prostate and seminal vesicles are normal in size and contour. No pelvic mass evident. Other: The appendix appears unremarkable. No periappendiceal region inflammation. There is no abscess or ascites in the abdomen or pelvis. There is mild fat in the right inguinal ring. Musculoskeletal: There are no blastic or lytic bone lesions. There is no intramuscular or abdominal wall lesion evident. IMPRESSION: 1. Cholelithiasis with apparent degree of gallbladder wall thickening and questionable pericholecystic fluid. Appearance of the gallbladder raises concern for acute cholecystitis. Correlation with gallbladder ultrasound may be advisable in this regard. 2. Appendix region appears normal. No bowel obstruction. No abscess in the abdomen or pelvis. 3.  No evident renal or ureteral calculus.  No hydronephrosis. Electronically Signed   By: Bretta BangWilliam  Woodruff III M.D.   On: 08/18/2018 08:53   [place] Abdomen Limited Ruq  Result Date: 08/18/2018 CLINICAL DATA:  Upper abdominal pain EXAM: ULTRASOUND  ABDOMEN LIMITED RIGHT UPPER QUADRANT COMPARISON:  CT abdomen and pelvis August 18, 2018 FINDINGS: Gallbladder: There is a combination of sludge and cholelithiasis within the gallbladder. The largest individual gallstone measures 6 mm in length. There is gallbladder wall thickening and edema with trace pericholecystic fluid. No sonographic Murphy sign noted by sonographer. Common bile duct: Diameter: 5 mm. No intrahepatic or extrahepatic biliary duct dilatation. Liver: No focal lesion identified. Within normal limits in parenchymal echogenicity. Portal vein is patent on color Doppler imaging with normal direction of blood flow towards the liver. There is trace ascites adjacent to the liver. IMPRESSION: Cholelithiasis and sludge within the gallbladder. Gallbladder wall is thickened and edematous with mild pericholecystic fluid. These are findings indicative of acute cholecystitis. Trace ascites adjacent to the liver noted. Electronically Signed   By: Bretta BangWilliam  Woodruff III M.D.   On: 08/18/2018 11:40    Procedures Procedures (including critical care time)  Medications Ordered in ED Medications  enoxaparin (LOVENOX) injection 40 mg (has no administration in time range)  0.9 % NaCl with KCl 20 mEq/ L  infusion (has no administration in time range)  ciprofloxacin (CIPRO) IVPB 400 mg (has no administration in time range)    And  metroNIDAZOLE (FLAGYL) IVPB 500 mg (500 mg Intravenous New Bag/Given 08/18/18 1420)  acetaminophen (TYLENOL) tablet 1,000 mg (1,000 mg Oral Given 08/18/18 1422)  oxyCODONE (Oxy IR/ROXICODONE) immediate release tablet 5-10 mg (has no administration in time range)  morphine 2 MG/ML injection 2-4 mg (has no administration in time range)  diphenhydrAMINE (BENADRYL) capsule 25 mg (has no administration in time range)    Or  diphenhydrAMINE (BENADRYL) injection 25 mg (has no administration in time range)  ondansetron (ZOFRAN-ODT) disintegrating tablet 4 mg (has no administration in  time range)    Or  ondansetron (ZOFRAN) injection 4 mg (has no administration in time range)  docusate sodium (COLACE) capsule 100 mg (has no administration in time range)  hydrALAZINE (APRESOLINE) injection 10 mg (has no administration in time range)  famotidine (PEPCID) tablet 20 mg (has no administration in time range)  iopamidol (ISOVUE-300) 61 % injection 100 mL (100 mLs Intravenous Contrast Given 08/18/18 0830)  iopamidol (ISOVUE-300) 61 % injection (  Contrast Given 08/18/18 1035)  sodium chloride (PF) 0.9 % injection (  Given by Other 08/18/18 1035)  morphine 4 MG/ML injection 4 mg (4 mg Intravenous Given 08/18/18 0941)  ondansetron (ZOFRAN) injection 4 mg (4 mg Intravenous Given 08/18/18 0941)  sodium chloride 0.9 % bolus 1,000 mL ( Intravenous Stopped 08/18/18 1043)  Initial Impression / Assessment and Plan / ED Course  I have reviewed the triage vital signs and the nursing notes.  Pertinent labs & imaging results that were available during my care of the patient were reviewed by me and considered in my medical decision making (see chart for details).     CT concerning for acute cholecystitis.  Spoke with general surgery and requesting abdominal ultrasound.  Will evaluate patient in the emergency department.  Final Clinical Impressions(s) / ED Diagnoses   Final diagnoses:  Acute cholecystitis    ED Discharge Orders    None       Loren Racer, MD 08/18/18 1426

## 2018-08-18 NOTE — ED Notes (Signed)
Attempted report. RN asked for CB in 2 minutes.

## 2018-08-19 ENCOUNTER — Inpatient Hospital Stay (HOSPITAL_COMMUNITY): Payer: PRIVATE HEALTH INSURANCE | Admitting: Anesthesiology

## 2018-08-19 ENCOUNTER — Encounter (HOSPITAL_COMMUNITY): Payer: Self-pay | Admitting: Certified Registered Nurse Anesthetist

## 2018-08-19 ENCOUNTER — Encounter (HOSPITAL_COMMUNITY): Admission: EM | Disposition: A | Discharge status: Home / Self Care | Payer: Self-pay | Source: Home / Self Care

## 2018-08-19 HISTORY — PX: CHOLECYSTECTOMY: SHX55

## 2018-08-19 LAB — CBC
HEMATOCRIT: 46.8 % (ref 39.0–52.0)
HEMOGLOBIN: 15.1 g/dL (ref 13.0–17.0)
MCH: 30.9 pg (ref 26.0–34.0)
MCHC: 32.3 g/dL (ref 30.0–36.0)
MCV: 95.9 fL (ref 80.0–100.0)
Platelets: 364 10*3/uL (ref 150–400)
RBC: 4.88 MIL/uL (ref 4.22–5.81)
RDW: 13.2 % (ref 11.5–15.5)
WBC: 10.9 10*3/uL — AB (ref 4.0–10.5)
nRBC: 0 % (ref 0.0–0.2)

## 2018-08-19 LAB — COMPREHENSIVE METABOLIC PANEL
ALT: 15 U/L (ref 0–44)
AST: 12 U/L — AB (ref 15–41)
Albumin: 3.6 g/dL (ref 3.5–5.0)
Alkaline Phosphatase: 54 U/L (ref 38–126)
Anion gap: 9 (ref 5–15)
BILIRUBIN TOTAL: 0.6 mg/dL (ref 0.3–1.2)
BUN: 7 mg/dL (ref 6–20)
CO2: 27 mmol/L (ref 22–32)
CREATININE: 1.15 mg/dL (ref 0.61–1.24)
Calcium: 8.8 mg/dL — ABNORMAL LOW (ref 8.9–10.3)
Chloride: 106 mmol/L (ref 98–111)
GFR calc Af Amer: >60 mL/min (ref >60)
GLUCOSE: 107 mg/dL — AB (ref 70–99)
Potassium: 3.6 mmol/L (ref 3.5–5.1)
Sodium: 142 mmol/L (ref 135–145)
TOTAL PROTEIN: 7.1 g/dL (ref 6.5–8.1)

## 2018-08-19 SURGERY — LAPAROSCOPIC CHOLECYSTECTOMY
Anesthesia: General | Site: Abdomen

## 2018-08-19 MED ORDER — LACTATED RINGERS IV SOLN
INTRAVENOUS | Status: DC | PRN
  Administered 2018-08-19 (×2): via INTRAVENOUS

## 2018-08-19 MED ORDER — LIDOCAINE 2% (20 MG/ML) 5 ML SYRINGE
INTRAMUSCULAR | Status: DC | PRN
  Administered 2018-08-19: 100 mg via INTRAVENOUS

## 2018-08-19 MED ORDER — IOPAMIDOL (ISOVUE-300) INJECTION 61%
INTRAVENOUS | Status: AC
  Filled 2018-08-19: qty 50

## 2018-08-19 MED ORDER — LIDOCAINE 2% (20 MG/ML) 5 ML SYRINGE
INTRAMUSCULAR | Status: AC
  Filled 2018-08-19: qty 5

## 2018-08-19 MED ORDER — ONDANSETRON HCL 4 MG/2ML IJ SOLN
INTRAMUSCULAR | Status: AC
  Filled 2018-08-19: qty 2

## 2018-08-19 MED ORDER — CIPROFLOXACIN IN D5W 400 MG/200ML IV SOLN
400.0000 mg | Freq: Two times a day (BID) | INTRAVENOUS | Status: DC
  Administered 2018-08-20 (×2): 400 mg via INTRAVENOUS
  Filled 2018-08-19 (×2): qty 200

## 2018-08-19 MED ORDER — HYDRALAZINE HCL 20 MG/ML IJ SOLN: 10.0000 mg | INTRAMUSCULAR | Status: DC | PRN

## 2018-08-19 MED ORDER — PIPERACILLIN-TAZOBACTAM 3.375 G IVPB: 3.3750 g | Freq: Three times a day (TID) | INTRAVENOUS | Status: DC

## 2018-08-19 MED ORDER — HEPARIN SODIUM (PORCINE) 5000 UNIT/ML IJ SOLN
5000.0000 [IU] | Freq: Three times a day (TID) | INTRAMUSCULAR | Status: DC
  Administered 2018-08-19: 5000 [IU] via SUBCUTANEOUS
  Filled 2018-08-19 (×3): qty 1

## 2018-08-19 MED ORDER — KCL IN DEXTROSE-NACL 20-5-0.45 MEQ/L-%-% IV SOLN
INTRAVENOUS | Status: DC
  Administered 2018-08-19: 19:00:00 via INTRAVENOUS
  Filled 2018-08-19: qty 1000

## 2018-08-19 MED ORDER — PANTOPRAZOLE SODIUM 40 MG IV SOLR
40.0000 mg | Freq: Every day | INTRAVENOUS | Status: DC
  Administered 2018-08-19: 40 mg via INTRAVENOUS
  Filled 2018-08-19: qty 40

## 2018-08-19 MED ORDER — PROPOFOL 10 MG/ML IV BOLUS
INTRAVENOUS | Status: DC | PRN
  Administered 2018-08-19: 200 mg via INTRAVENOUS

## 2018-08-19 MED ORDER — ROCURONIUM BROMIDE 100 MG/10ML IV SOLN
INTRAVENOUS | Status: AC
  Filled 2018-08-19: qty 1

## 2018-08-19 MED ORDER — MORPHINE SULFATE (PF) 2 MG/ML IV SOLN
1.0000 mg | INTRAVENOUS | Status: DC | PRN
  Administered 2018-08-19: 1 mg via INTRAVENOUS
  Filled 2018-08-19: qty 1

## 2018-08-19 MED ORDER — FENTANYL CITRATE (PF) 100 MCG/2ML IJ SOLN: 25.0000 ug | INTRAMUSCULAR | Status: DC | PRN

## 2018-08-19 MED ORDER — FENTANYL CITRATE (PF) 100 MCG/2ML IJ SOLN
INTRAMUSCULAR | Status: DC | PRN
  Administered 2018-08-19 (×4): 50 ug via INTRAVENOUS
  Administered 2018-08-19: 100 ug via INTRAVENOUS

## 2018-08-19 MED ORDER — RINGERS IRRIGATION IR SOLN
Status: DC | PRN
  Administered 2018-08-19: 1

## 2018-08-19 MED ORDER — 0.9 % SODIUM CHLORIDE (POUR BTL) OPTIME
TOPICAL | Status: DC | PRN
  Administered 2018-08-19: 1000 mL

## 2018-08-19 MED ORDER — DEXAMETHASONE SODIUM PHOSPHATE 10 MG/ML IJ SOLN
INTRAMUSCULAR | Status: DC | PRN
  Administered 2018-08-19: 10 mg via INTRAVENOUS

## 2018-08-19 MED ORDER — OXYCODONE HCL 5 MG PO TABS: 5.0000 mg | ORAL_TABLET | Freq: Once | ORAL | Status: DC | PRN

## 2018-08-19 MED ORDER — LACTATED RINGERS IV SOLN
INTRAVENOUS | Status: DC
  Administered 2018-08-19: 15:00:00 via INTRAVENOUS

## 2018-08-19 MED ORDER — PROPOFOL 10 MG/ML IV BOLUS
INTRAVENOUS | Status: AC
  Filled 2018-08-19: qty 20

## 2018-08-19 MED ORDER — BUPIVACAINE LIPOSOME 1.3 % IJ SUSP
20.0000 mL | INTRAMUSCULAR | Status: DC
  Filled 2018-08-19: qty 20

## 2018-08-19 MED ORDER — FENTANYL CITRATE (PF) 100 MCG/2ML IJ SOLN
INTRAMUSCULAR | Status: AC
  Filled 2018-08-19: qty 2

## 2018-08-19 MED ORDER — MIDAZOLAM HCL 2 MG/2ML IJ SOLN
INTRAMUSCULAR | Status: AC
  Filled 2018-08-19: qty 2

## 2018-08-19 MED ORDER — METRONIDAZOLE IN NACL 5-0.79 MG/ML-% IV SOLN
500.0000 mg | Freq: Three times a day (TID) | INTRAVENOUS | Status: DC
  Administered 2018-08-20 (×2): 500 mg via INTRAVENOUS
  Filled 2018-08-19 (×2): qty 100

## 2018-08-19 MED ORDER — ROCURONIUM BROMIDE 10 MG/ML (PF) SYRINGE
PREFILLED_SYRINGE | INTRAVENOUS | Status: DC | PRN
  Administered 2018-08-19: 50 mg via INTRAVENOUS
  Administered 2018-08-19 (×2): 10 mg via INTRAVENOUS

## 2018-08-19 MED ORDER — ONDANSETRON HCL 4 MG/2ML IJ SOLN
INTRAMUSCULAR | Status: DC | PRN
  Administered 2018-08-19: 4 mg via INTRAVENOUS

## 2018-08-19 MED ORDER — OXYCODONE HCL 5 MG/5ML PO SOLN
5.0000 mg | Freq: Once | ORAL | Status: DC | PRN
  Filled 2018-08-19: qty 5

## 2018-08-19 MED ORDER — SUGAMMADEX SODIUM 500 MG/5ML IV SOLN
INTRAVENOUS | Status: DC | PRN
  Administered 2018-08-19: 300 mg via INTRAVENOUS

## 2018-08-19 MED ORDER — MIDAZOLAM HCL 5 MG/5ML IJ SOLN
INTRAMUSCULAR | Status: DC | PRN
  Administered 2018-08-19: 2 mg via INTRAVENOUS

## 2018-08-19 MED ORDER — LACTATED RINGERS IV SOLN: INTRAVENOUS | Status: DC

## 2018-08-19 MED ORDER — BUPIVACAINE LIPOSOME 1.3 % IJ SUSP
INTRAMUSCULAR | Status: DC | PRN
  Administered 2018-08-19: 20 mL

## 2018-08-19 MED ORDER — PROMETHAZINE HCL 25 MG/ML IJ SOLN: 6.2500 mg | INTRAMUSCULAR | Status: DC | PRN

## 2018-08-19 MED ORDER — ACETAMINOPHEN 10 MG/ML IV SOLN: 1000.0000 mg | Freq: Once | INTRAVENOUS | Status: DC | PRN

## 2018-08-19 MED ORDER — FENTANYL CITRATE (PF) 250 MCG/5ML IJ SOLN
INTRAMUSCULAR | Status: AC
  Filled 2018-08-19: qty 5

## 2018-08-19 MED ORDER — ONDANSETRON 4 MG PO TBDP: 4.0000 mg | ORAL_TABLET | Freq: Four times a day (QID) | ORAL | Status: DC | PRN

## 2018-08-19 MED ORDER — SUGAMMADEX SODIUM 500 MG/5ML IV SOLN
INTRAVENOUS | Status: AC
  Filled 2018-08-19: qty 5

## 2018-08-19 MED ORDER — ONDANSETRON HCL 4 MG/2ML IJ SOLN: 4.0000 mg | Freq: Four times a day (QID) | INTRAMUSCULAR | Status: DC | PRN

## 2018-08-19 SURGICAL SUPPLY — 39 items
APPLICATOR COTTON TIP 6 STRL (MISCELLANEOUS) ×2 IMPLANT
APPLICATOR COTTON TIP 6IN STRL (MISCELLANEOUS) ×6
APPLIER CLIP ROT 10 11.4 M/L (STAPLE) ×3
BENZOIN TINCTURE PRP APPL 2/3 (GAUZE/BANDAGES/DRESSINGS) IMPLANT
CABLE HIGH FREQUENCY MONO STRZ (ELECTRODE) ×3 IMPLANT
CATH REDDICK CHOLANGI 4FR 50CM (CATHETERS) ×3 IMPLANT
CLIP APPLIE ROT 10 11.4 M/L (STAPLE) ×1 IMPLANT
CLOSURE WOUND 1/2 X4 (GAUZE/BANDAGES/DRESSINGS)
COVER MAYO STAND STRL (DRAPES) ×3 IMPLANT
COVER SURGICAL LIGHT HANDLE (MISCELLANEOUS) ×3 IMPLANT
COVER WAND RF STERILE (DRAPES) IMPLANT
DECANTER SPIKE VIAL GLASS SM (MISCELLANEOUS) ×3 IMPLANT
DERMABOND ADVANCED (GAUZE/BANDAGES/DRESSINGS) ×2
DERMABOND ADVANCED .7 DNX12 (GAUZE/BANDAGES/DRESSINGS) ×1 IMPLANT
DRAPE C-ARM 42X120 X-RAY (DRAPES) IMPLANT
ELECT PENCIL ROCKER SW 15FT (MISCELLANEOUS) ×3 IMPLANT
ELECT REM PT RETURN 15FT ADLT (MISCELLANEOUS) ×3 IMPLANT
GLOVE BIOGEL M 8.0 STRL (GLOVE) ×3 IMPLANT
GOWN STRL REUS W/TWL XL LVL3 (GOWN DISPOSABLE) ×9 IMPLANT
HEMOSTAT SURGICEL 4X8 (HEMOSTASIS) IMPLANT
IV CATH 14GX2 1/4 (CATHETERS) ×3 IMPLANT
KIT BASIN OR (CUSTOM PROCEDURE TRAY) ×3 IMPLANT
L-HOOK LAP DISP 36CM (ELECTROSURGICAL) ×3
LHOOK LAP DISP 36CM (ELECTROSURGICAL) ×1 IMPLANT
POUCH RETRIEVAL ECOSAC 10 (ENDOMECHANICALS) IMPLANT
POUCH RETRIEVAL ECOSAC 10MM (ENDOMECHANICALS)
SCISSORS LAP 5X45 EPIX DISP (ENDOMECHANICALS) ×3 IMPLANT
SET IRRIG TUBING LAPAROSCOPIC (IRRIGATION / IRRIGATOR) ×3 IMPLANT
SLEEVE XCEL OPT CAN 5 100 (ENDOMECHANICALS) ×3 IMPLANT
STRIP CLOSURE SKIN 1/2X4 (GAUZE/BANDAGES/DRESSINGS) IMPLANT
SUT MNCRL AB 4-0 PS2 18 (SUTURE) ×6 IMPLANT
SYR 20CC LL (SYRINGE) ×3 IMPLANT
TOWEL OR 17X26 10 PK STRL BLUE (TOWEL DISPOSABLE) ×3 IMPLANT
TRAY LAPAROSCOPIC (CUSTOM PROCEDURE TRAY) ×3 IMPLANT
TROCAR BLADELESS OPT 5 100 (ENDOMECHANICALS) ×3 IMPLANT
TROCAR XCEL BLUNT TIP 100MML (ENDOMECHANICALS) IMPLANT
TROCAR XCEL NON-BLD 11X100MML (ENDOMECHANICALS) ×3 IMPLANT
TUBING INSUF HEATED (TUBING) IMPLANT
TUBING INSUFFLATION 10FT LAP (TUBING) ×3 IMPLANT

## 2018-08-19 NOTE — Progress Notes (Signed)
Patient ID: Jesse Shelton, male   DOB: [birth year], 37 y.o.   MRN: [id number] [facility] Progress Note:   Day of Surgery  Subjective: Mental status is alert; patient is NPO Objective: Vital signs in last 24 hours: Temp:  [98.4 F (36.9 C)-100.3 F (37.9 C)] 98.4 F (36.9 C) (11/21 0509) Pulse Rate:  [53-81] 66 (11/21 0509) Resp:  [14-20] 18 (11/21 0509) BP: (119-164)/(72-112) 119/72 (11/21 0509) SpO2:  [96 %-100 %] 98 % (11/21 0509)  Intake/Output from previous day: 11/20 0701 - 11/21 0700 In: 3185.4 [P.O.:960; I.V.:825; IV Piggyback:1400.4] Out: 1800 [Urine:1800] Intake/Output this shift: Total I/O In: -  Out: 650 [Urine:650]  Physical Exam: Work of breathing is not labored.  Still with some right upper quadrant pain  Lab Results:  Results for orders placed or performed during the hospital encounter of 08/18/18 (from the past 48 hour(s))  Lipase, blood     Status: Abnormal   Collection Time: 08/18/18  7:07 AM  Result Value Ref Range   Lipase 109 (H) 11 - 51 U/L    Comment: Performed at [facility], [place] [address] [place], [place] [address]  Comprehensive metabolic panel     Status: Abnormal   Collection Time: 08/18/18  7:07 AM  Result Value Ref Range   Sodium 141 135 - 145 mmol/L   Potassium 3.6 3.5 - 5.1 mmol/L   Chloride 107 98 - 111 mmol/L   CO2 24 22 - 32 mmol/L   Glucose, Bld 140 (H) 70 - 99 mg/dL   BUN 6 6 - 20 mg/dL   Creatinine, Ser 1.04 0.61 - 1.24 mg/dL   Calcium 9.5 8.9 - 10.3 mg/dL   Total Protein 8.0 6.5 - 8.1 g/dL   Albumin 4.2 3.5 - 5.0 g/dL   AST 16 15 - 41 U/L   ALT 20 0 - 44 U/L   Alkaline Phosphatase 64 38 - 126 U/L   Total Bilirubin 0.7 0.3 - 1.2 mg/dL   GFR calc non Af Amer >60 >60 mL/min   GFR calc Af Amer >60 >60 mL/min    Comment: (NOTE) The eGFR has been calculated using the CKD EPI equation. This calculation has not been validated in all clinical situations. eGFR's persistently <60 mL/min signify  possible Chronic Kidney Disease.    Anion gap 10 5 - 15    Comment: Performed at [facility], [place] [address] [place], [place] [address]  CBC     Status: Abnormal   Collection Time: 08/18/18  7:07 AM  Result Value Ref Range   WBC 12.8 (H) 4.0 - 10.5 K/uL   RBC 5.54 4.22 - 5.81 MIL/uL   Hemoglobin 16.8 13.0 - 17.0 g/dL   HCT 50.6 39.0 - 52.0 %   MCV 91.3 80.0 - 100.0 fL   MCH 30.3 26.0 - 34.0 pg   MCHC 33.2 30.0 - 36.0 g/dL   RDW 13.0 11.5 - 15.5 %   Platelets 431 (H) 150 - 400 K/uL   nRBC 0.0 0.0 - 0.2 %    Comment: Performed at [facility], [place] [address], [place], [place] [address]  Urinalysis, Routine w reflex microscopic     Status: Abnormal   Collection Time: 08/18/18  8:45 AM  Result Value Ref Range   Color, Urine YELLOW YELLOW   APPearance CLEAR CLEAR   Specific Gravity, Urine 1.028 1.005 - 1.030   pH 6.0 5.0 - 8.0   Glucose, UA NEGATIVE NEGATIVE mg/dL  Hgb urine dipstick NEGATIVE NEGATIVE   Bilirubin Urine NEGATIVE NEGATIVE   Ketones, ur 20 (A) NEGATIVE mg/dL   Protein, ur NEGATIVE NEGATIVE mg/dL   Nitrite NEGATIVE NEGATIVE   Leukocytes, UA TRACE (A) NEGATIVE   RBC / HPF 0-5 0 - 5 RBC/hpf   WBC, UA 0-5 0 - 5 WBC/hpf   Bacteria, UA NONE SEEN NONE SEEN   Squamous Epithelial / LPF 0-5 0 - 5   Mucus PRESENT     Comment: Performed at [facility], [place] [address], [place], [place] [address]  Surgical pcr screen     Status: None   Collection Time: 08/18/18  5:03 PM  Result Value Ref Range   MRSA, PCR NEGATIVE NEGATIVE   Staphylococcus aureus NEGATIVE NEGATIVE    Comment: (NOTE) The Xpert SA Assay (FDA approved for NASAL specimens in patients 22 years of age and older), is one component of a comprehensive surveillance program. It is not intended to diagnose infection nor to guide or monitor treatment. Performed at [facility], [place] [address], [place], [place] [address]   Comprehensive  metabolic panel     Status: Abnormal   Collection Time: 08/19/18  4:58 AM  Result Value Ref Range   Sodium 142 135 - 145 mmol/L   Potassium 3.6 3.5 - 5.1 mmol/L   Chloride 106 98 - 111 mmol/L   CO2 27 22 - 32 mmol/L   Glucose, Bld 107 (H) 70 - 99 mg/dL   BUN 7 6 - 20 mg/dL   Creatinine, Ser 1.15 0.61 - 1.24 mg/dL   Calcium 8.8 (L) 8.9 - 10.3 mg/dL   Total Protein 7.1 6.5 - 8.1 g/dL   Albumin 3.6 3.5 - 5.0 g/dL   AST 12 (L) 15 - 41 U/L   ALT 15 0 - 44 U/L   Alkaline Phosphatase 54 38 - 126 U/L   Total Bilirubin 0.6 0.3 - 1.2 mg/dL   GFR calc non Af Amer >60 >60 mL/min   GFR calc Af Amer >60 >60 mL/min    Comment: (NOTE) The eGFR has been calculated using the CKD EPI equation. This calculation has not been validated in all clinical situations. eGFR's persistently <60 mL/min signify possible Chronic Kidney Disease.    Anion gap 9 5 - 15    Comment: Performed at [facility], [place] [address], [place], [place] [address]  CBC     Status: Abnormal   Collection Time: 08/19/18  4:58 AM  Result Value Ref Range   WBC 10.9 (H) 4.0 - 10.5 K/uL   RBC 4.88 4.22 - 5.81 MIL/uL   Hemoglobin 15.1 13.0 - 17.0 g/dL   HCT 46.8 39.0 - 52.0 %   MCV 95.9 80.0 - 100.0 fL   MCH 30.9 26.0 - 34.0 pg   MCHC 32.3 30.0 - 36.0 g/dL   RDW 13.2 11.5 - 15.5 %   Platelets 364 150 - 400 K/uL   nRBC 0.0 0.0 - 0.2 %    Comment: Performed at [facility], [place] [address] [place], [place] [address]    Radiology/Results: Ct Abdomen Pelvis W Contrast  Result Date: 08/18/2018 CLINICAL DATA:  Lower abdominal pain with diarrhea and vomiting EXAM: CT ABDOMEN AND PELVIS WITH CONTRAST TECHNIQUE: Multidetector CT imaging of the abdomen and pelvis was performed using the standard protocol following bolus administration of intravenous contrast. CONTRAST:  160m ISOVUE-300 IOPAMIDOL (ISOVUE-300) INJECTION 61% COMPARISON:  None. FINDINGS: Lower chest: Lung bases are clear.  Hepatobiliary:  No focal liver lesions are evident. There are multiple gallstones and sludge within the gallbladder. There are areas of apparent gallbladder wall thickening with a questionable area of pericholecystic fluid. There is no biliary duct dilatation. Pancreas: No pancreatic mass or inflammatory focus no. Spleen: No splenic lesions are evident. Adrenals/Urinary Tract: Adrenals bilaterally appear unremarkable. Kidneys bilaterally show no evident mass or hydronephrosis on either side. There is no renal or ureteral calculus on either side. Urinary bladder is midline with wall thickness within normal limits. Stomach/Bowel: There is no appreciable bowel wall or mesenteric thickening. There is no evident bowel obstruction. There is no free air or portal venous air. Vascular/Lymphatic: There is no abdominal aortic aneurysm. No vascular lesions are evident. No adenopathy is evident in the abdomen or pelvis. Reproductive: Prostate and seminal vesicles are normal in size and contour. No pelvic mass evident. Other: The appendix appears unremarkable. No periappendiceal region inflammation. There is no abscess or ascites in the abdomen or pelvis. There is mild fat in the right inguinal ring. Musculoskeletal: There are no blastic or lytic bone lesions. There is no intramuscular or abdominal wall lesion evident. IMPRESSION: 1. Cholelithiasis with apparent degree of gallbladder wall thickening and questionable pericholecystic fluid. Appearance of the gallbladder raises concern for acute cholecystitis. Correlation with gallbladder ultrasound may be advisable in this regard. 2. Appendix region appears normal. No bowel obstruction. No abscess in the abdomen or pelvis. 3.  No evident renal or ureteral calculus.  No hydronephrosis. Electronically Signed   By: Lowella Grip III M.D.   On: 08/18/2018 08:53   [place] Abdomen Limited Ruq  Result Date: 08/18/2018 CLINICAL DATA:  Upper abdominal pain EXAM: ULTRASOUND ABDOMEN LIMITED RIGHT UPPER  QUADRANT COMPARISON:  CT abdomen and pelvis August 18, 2018 FINDINGS: Gallbladder: There is a combination of sludge and cholelithiasis within the gallbladder. The largest individual gallstone measures 6 mm in length. There is gallbladder wall thickening and edema with trace pericholecystic fluid. No sonographic Murphy sign noted by sonographer. Common bile duct: Diameter: 5 mm. No intrahepatic or extrahepatic biliary duct dilatation. Liver: No focal lesion identified. Within normal limits in parenchymal echogenicity. Portal vein is patent on color Doppler imaging with normal direction of blood flow towards the liver. There is trace ascites adjacent to the liver. IMPRESSION: Cholelithiasis and sludge within the gallbladder. Gallbladder wall is thickened and edematous with mild pericholecystic fluid. These are findings indicative of acute cholecystitis. Trace ascites adjacent to the liver noted. Electronically Signed   By: Lowella Grip III M.D.   On: 08/18/2018 11:40    Anti-infectives: Anti-infectives (From admission, onward)   Start     Dose/Rate Route Frequency Ordered Stop   08/18/18 1630  valACYclovir (VALTREX) tablet 1,000 mg     1,000 mg Oral Daily 08/18/18 1442     08/18/18 1400  ciprofloxacin (CIPRO) IVPB 400 mg     400 mg 200 mL/hr over 60 Minutes Intravenous Every 12 hours 08/18/18 1332     08/18/18 1400  metroNIDAZOLE (FLAGYL) IVPB 500 mg     500 mg 100 mL/hr over 60 Minutes Intravenous Every 8 hours 08/18/18 1332        Assessment/Plan: Problem List: Patient Active Problem List   Diagnosis Date Noted  . Acute cholecystitis 08/18/2018  . High risk sexual behavior 11/10/2017  . Depression, major, recurrent, moderate ([place]) 07/10/2016    Class: Chronic  . Pre-diabetes 10/25/2015  . OSA (obstructive sleep apnea) 02/14/2015  . Genital herpes 10/18/2014  . 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 Morbid obesity ([place]) 10/18/2014    For lap chole later  today Day of Surgery    LOS: 1 day   Matt B. Hassell Done, MD, [facility] [phone] beeper [phone]  08/19/2018 9:40 AM

## 2018-08-19 NOTE — Progress Notes (Signed)
Pt transferred to pre-op with surgical staff; v/s stable prior to transfer; A&O x4; no belongings with pt at time of transfer; family to OR waiting room

## 2018-08-19 NOTE — H&P (View-Only) (Signed)
Patient ID: Jesse Shelton, male   DOB: [birth year], 37 y.o.   MRN: [id number] [facility] Progress Note:   Day of Surgery  Subjective: Mental status is alert; patient is NPO Objective: Vital signs in last 24 hours: Temp:  [98.4 F (36.9 C)-100.3 F (37.9 C)] 98.4 F (36.9 C) (11/21 0509) Pulse Rate:  [53-81] 66 (11/21 0509) Resp:  [14-20] 18 (11/21 0509) BP: (119-164)/(72-112) 119/72 (11/21 0509) SpO2:  [96 %-100 %] 98 % (11/21 0509)  Intake/Output from previous day: 11/20 0701 - 11/21 0700 In: 3185.4 [P.O.:960; I.V.:825; IV Piggyback:1400.4] Out: 1800 [Urine:1800] Intake/Output this shift: Total I/O In: -  Out: 650 [Urine:650]  Physical Exam: Work of breathing is not labored.  Still with some right upper quadrant pain  Lab Results:  Results for orders placed or performed during the hospital encounter of 08/18/18 (from the past 48 hour(s))  Lipase, blood     Status: Abnormal   Collection Time: 08/18/18  7:07 AM  Result Value Ref Range   Lipase 109 (H) 11 - 51 U/L    Comment: Performed at [facility], [place] [address] [place], [place] [address]  Comprehensive metabolic panel     Status: Abnormal   Collection Time: 08/18/18  7:07 AM  Result Value Ref Range   Sodium 141 135 - 145 mmol/L   Potassium 3.6 3.5 - 5.1 mmol/L   Chloride 107 98 - 111 mmol/L   CO2 24 22 - 32 mmol/L   Glucose, Bld 140 (H) 70 - 99 mg/dL   BUN 6 6 - 20 mg/dL   Creatinine, Ser 1.04 0.61 - 1.24 mg/dL   Calcium 9.5 8.9 - 10.3 mg/dL   Total Protein 8.0 6.5 - 8.1 g/dL   Albumin 4.2 3.5 - 5.0 g/dL   AST 16 15 - 41 U/L   ALT 20 0 - 44 U/L   Alkaline Phosphatase 64 38 - 126 U/L   Total Bilirubin 0.7 0.3 - 1.2 mg/dL   GFR calc non Af Amer >60 >60 mL/min   GFR calc Af Amer >60 >60 mL/min    Comment: (NOTE) The eGFR has been calculated using the CKD EPI equation. This calculation has not been validated in all clinical situations. eGFR's persistently <60 mL/min signify  possible Chronic Kidney Disease.    Anion gap 10 5 - 15    Comment: Performed at [facility] [place] [address] [place], [place] [address]  CBC     Status: Abnormal   Collection Time: 08/18/18  7:07 AM  Result Value Ref Range   WBC 12.8 (H) 4.0 - 10.5 K/uL   RBC 5.54 4.22 - 5.81 MIL/uL   Hemoglobin 16.8 13.0 - 17.0 g/dL   HCT 50.6 39.0 - 52.0 %   MCV 91.3 80.0 - 100.0 fL   MCH 30.3 26.0 - 34.0 pg   MCHC 33.2 30.0 - 36.0 g/dL   RDW 13.0 11.5 - 15.5 %   Platelets 431 (H) 150 - 400 K/uL   nRBC 0.0 0.0 - 0.2 %    Comment: Performed at [facility], [place] [address] [place], [place] [address]  Urinalysis, Routine w reflex microscopic     Status: Abnormal   Collection Time: 08/18/18  8:45 AM  Result Value Ref Range   Color, Urine YELLOW YELLOW   APPearance CLEAR CLEAR   Specific Gravity, Urine 1.028 1.005 - 1.030   pH 6.0 5.0 - 8.0   Glucose, UA NEGATIVE NEGATIVE mg/dL  Hgb urine dipstick NEGATIVE NEGATIVE   Bilirubin Urine NEGATIVE NEGATIVE   Ketones, ur 20 (A) NEGATIVE mg/dL   Protein, ur NEGATIVE NEGATIVE mg/dL   Nitrite NEGATIVE NEGATIVE   Leukocytes, UA TRACE (A) NEGATIVE   RBC / HPF 0-5 0 - 5 RBC/hpf   WBC, UA 0-5 0 - 5 WBC/hpf   Bacteria, UA NONE SEEN NONE SEEN   Squamous Epithelial / LPF 0-5 0 - 5   Mucus PRESENT     Comment: Performed at [facility], [place] [address] [place], [place] [address]  Surgical pcr screen     Status: None   Collection Time: 08/18/18  5:03 PM  Result Value Ref Range   MRSA, PCR NEGATIVE NEGATIVE   Staphylococcus aureus NEGATIVE NEGATIVE    Comment: (NOTE) The Xpert SA Assay (FDA approved for NASAL specimens in patients 46 years of age and older), is one component of a comprehensive surveillance program. It is not intended to diagnose infection nor to guide or monitor treatment. Performed at [facility], [place] [address], [place], [place] [address]   Comprehensive  metabolic panel     Status: Abnormal   Collection Time: 08/19/18  4:58 AM  Result Value Ref Range   Sodium 142 135 - 145 mmol/L   Potassium 3.6 3.5 - 5.1 mmol/L   Chloride 106 98 - 111 mmol/L   CO2 27 22 - 32 mmol/L   Glucose, Bld 107 (H) 70 - 99 mg/dL   BUN 7 6 - 20 mg/dL   Creatinine, Ser 1.15 0.61 - 1.24 mg/dL   Calcium 8.8 (L) 8.9 - 10.3 mg/dL   Total Protein 7.1 6.5 - 8.1 g/dL   Albumin 3.6 3.5 - 5.0 g/dL   AST 12 (L) 15 - 41 U/L   ALT 15 0 - 44 U/L   Alkaline Phosphatase 54 38 - 126 U/L   Total Bilirubin 0.6 0.3 - 1.2 mg/dL   GFR calc non Af Amer >60 >60 mL/min   GFR calc Af Amer >60 >60 mL/min    Comment: (NOTE) The eGFR has been calculated using the CKD EPI equation. This calculation has not been validated in all clinical situations. eGFR's persistently <60 mL/min signify possible Chronic Kidney Disease.    Anion gap 9 5 - 15    Comment: Performed at [facility], [place] [address], [place], [place] [address]  CBC     Status: Abnormal   Collection Time: 08/19/18  4:58 AM  Result Value Ref Range   WBC 10.9 (H) 4.0 - 10.5 K/uL   RBC 4.88 4.22 - 5.81 MIL/uL   Hemoglobin 15.1 13.0 - 17.0 g/dL   HCT 46.8 39.0 - 52.0 %   MCV 95.9 80.0 - 100.0 fL   MCH 30.9 26.0 - 34.0 pg   MCHC 32.3 30.0 - 36.0 g/dL   RDW 13.2 11.5 - 15.5 %   Platelets 364 150 - 400 K/uL   nRBC 0.0 0.0 - 0.2 %    Comment: Performed at [facility], [place] [address], [place],  [address]    Radiology/Results: Ct Abdomen Pelvis W Contrast  Result Date: 08/18/2018 CLINICAL DATA:  Lower abdominal pain with diarrhea and vomiting EXAM: CT ABDOMEN AND PELVIS WITH CONTRAST TECHNIQUE: Multidetector CT imaging of the abdomen and pelvis was performed using the standard protocol following bolus administration of intravenous contrast. CONTRAST:  161m ISOVUE-300 IOPAMIDOL (ISOVUE-300) INJECTION 61% COMPARISON:  None. FINDINGS: Lower chest: Lung bases are clear.  Hepatobiliary:  No focal liver lesions are evident. There are multiple gallstones and sludge within the gallbladder. There are areas of apparent gallbladder wall thickening with a questionable area of pericholecystic fluid. There is no biliary duct dilatation. Pancreas: No pancreatic mass or inflammatory focus no. Spleen: No splenic lesions are evident. Adrenals/Urinary Tract: Adrenals bilaterally appear unremarkable. Kidneys bilaterally show no evident mass or hydronephrosis on either side. There is no renal or ureteral calculus on either side. Urinary bladder is midline with wall thickness within normal limits. Stomach/Bowel: There is no appreciable bowel wall or mesenteric thickening. There is no evident bowel obstruction. There is no free air or portal venous air. Vascular/Lymphatic: There is no abdominal aortic aneurysm. No vascular lesions are evident. No adenopathy is evident in the abdomen or pelvis. Reproductive: Prostate and seminal vesicles are normal in size and contour. No pelvic mass evident. Other: The appendix appears unremarkable. No periappendiceal region inflammation. There is no abscess or ascites in the abdomen or pelvis. There is mild fat in the right inguinal ring. Musculoskeletal: There are no blastic or lytic bone lesions. There is no intramuscular or abdominal wall lesion evident. IMPRESSION: 1. Cholelithiasis with apparent degree of gallbladder wall thickening and questionable pericholecystic fluid. Appearance of the gallbladder raises concern for acute cholecystitis. Correlation with gallbladder ultrasound may be advisable in this regard. 2. Appendix region appears normal. No bowel obstruction. No abscess in the abdomen or pelvis. 3.  No evident renal or ureteral calculus.  No hydronephrosis. Electronically Signed   By: Lowella Grip III M.D.   On: 08/18/2018 08:53   US Abdomen Limited Ruq  Result Date: 08/18/2018 CLINICAL DATA:  Upper abdominal pain EXAM: ULTRASOUND ABDOMEN LIMITED RIGHT UPPER  QUADRANT COMPARISON:  CT abdomen and pelvis August 18, 2018 FINDINGS: Gallbladder: There is a combination of sludge and cholelithiasis within the gallbladder. The largest individual gallstone measures 6 mm in length. There is gallbladder wall thickening and edema with trace pericholecystic fluid. No sonographic Murphy sign noted by sonographer. Common bile duct: Diameter: 5 mm. No intrahepatic or extrahepatic biliary duct dilatation. Liver: No focal lesion identified. Within normal limits in parenchymal echogenicity. Portal vein is patent on color Doppler imaging with normal direction of blood flow towards the liver. There is trace ascites adjacent to the liver. IMPRESSION: Cholelithiasis and sludge within the gallbladder. Gallbladder wall is thickened and edematous with mild pericholecystic fluid. These are findings indicative of acute cholecystitis. Trace ascites adjacent to the liver noted. Electronically Signed   By: Lowella Grip III M.D.   On: 08/18/2018 11:40    Anti-infectives: Anti-infectives (From admission, onward)   Start     Dose/Rate Route Frequency Ordered Stop   08/18/18 1630  valACYclovir (VALTREX) tablet 1,000 mg     1,000 mg Oral Daily 08/18/18 1442     08/18/18 1400  ciprofloxacin (CIPRO) IVPB 400 mg     400 mg 200 mL/hr over 60 Minutes Intravenous Every 12 hours 08/18/18 1332     08/18/18 1400  metroNIDAZOLE (FLAGYL) IVPB 500 mg     500 mg 100 mL/hr over 60 Minutes Intravenous Every 8 hours 08/18/18 1332        Assessment/Plan: Problem List: Patient Active Problem List   Diagnosis Date Noted  . Acute cholecystitis 08/18/2018  . High risk sexual behavior 11/10/2017  . Depression, major, recurrent, moderate ([place]) 07/10/2016    Class: Chronic  . Pre-diabetes 10/25/2015  . OSA (obstructive sleep apnea) 02/14/2015  . Genital herpes 10/18/2014  . 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 Morbid obesity ([place]) 10/18/2014    For lap chole later  today Day of Surgery    LOS: 1 day   Matt B. Hassell Done, MD, [facility] [phone] beeper [phone]  08/19/2018 9:40 AM

## 2018-08-19 NOTE — Anesthesia Postprocedure Evaluation (Signed)
Anesthesia Post Note  Patient: Jesse Shelton  Procedure(s) Performed: LAPAROSCOPIC CHOLECYSTECTOMY (N/A Abdomen)     Patient location during evaluation: PACU Anesthesia Type: General Level of consciousness: awake and alert Pain management: pain level controlled Vital Signs Assessment: post-procedure vital signs reviewed and stable Respiratory status: spontaneous breathing, nonlabored ventilation, respiratory function stable and patient connected to nasal cannula oxygen Cardiovascular status: blood pressure returned to baseline and stable Postop Assessment: no apparent nausea or vomiting Anesthetic complications: no    Last Vitals:  Vitals:   08/19/18 2040 08/19/18 2135  BP: (!) 145/92 (!) 146/102  Pulse: 88 95  Resp: 19 18  Temp: 36.7 C 37.1 C  SpO2: 100% 97%    Last Pain:  Vitals:   08/19/18 2135  TempSrc: Oral  PainSc:                  Jesse Shelton

## 2018-08-19 NOTE — Op Note (Signed)
Jesse Shelton  Primary Care Physician:  Wendee BeaversMcMullen, David J, DO    08/19/2018  5:55 PM  Procedure: Laparoscopic Cholecystectomy Surgeon: Susy FrizzleMatt B. Daphine DeutscherMartin, MD, FACS Asst:  none  Anes:  General  Drains:  None  Findings: Acute cholecystitis  Description of Procedure: The patient was taken to OR 3 and given general anesthesia.  The patient was prepped with PCMX and draped sterilely. A time out was performed.  Access to the abdomen was achieved with a 5 mm in the right upper quadrant.  Port placement included three 5 mm trocars and one 11 mm trocar in the upper midline.    The gallbladder was visualized, decompressed and the fundus was grasped and the gallbladder was elevated. Traction on the infundibulum allowed for successful demonstration of the critical view. Inflammatory changes were acute and edematous.  The cystic duct was identified and was prominent and clipped up on the gallbladder.  The distal cystic duct was triple clipped and divided.    The cystic duct was then triple clipped and divided, the cystic artery was double clipped and divided and then the gallbladder was removed from the gallbladder bed. Removal of the gallbladder from the gallbladder bed was performed and the gallbladder was quite inflamed.  The gallbladder was then placed in a bag and brought out through one of the trocar sites. The gallbladder bed was inspected and no bleeding or bile leaks were seen.      Incisions were injected with Exparel and closed with 4-0 Monocryl and Dermabond on the skin.  Sponge and needle count were correct.    The patient was taken to the recovery room in satisfactory condition.

## 2018-08-19 NOTE — Interval H&P Note (Signed)
History and Physical Interval Note:  08/19/2018 3:54 PM  Jesse Shelton  has presented today for surgery, with the diagnosis of ACUTE CHOLECYSTITIS  The various methods of treatment have been discussed with the patient and family. After consideration of risks, benefits and other options for treatment, the patient has consented to  Procedure(s): LAPAROSCOPIC CHOLECYSTECTOMY (N/A) as a surgical intervention .  The patient's history has been reviewed, patient examined, no change in status, stable for surgery.  I have reviewed the patient's chart and labs.  Questions were answered to the patient's satisfaction.     Valarie MerinoMatthew B Fanny Agan

## 2018-08-19 NOTE — Anesthesia Preprocedure Evaluation (Addendum)
Anesthesia Evaluation  Patient identified by MRN, date of birth, ID band  Reviewed: Allergy & Precautions, NPO status , Patient's Chart, lab work & pertinent test results  History of Anesthesia Complications Negative for: history of anesthetic complications  Airway Mallampati: III  TM Distance: >3 FB Neck ROM: Full    Dental no notable dental hx. (+) Teeth Intact   Pulmonary neg pulmonary ROS, sleep apnea , Current Smoker,    Pulmonary exam normal        Cardiovascular negative cardio ROS Normal cardiovascular exam     Neuro/Psych Anxiety Depression negative neurological ROS  negative psych ROS   GI/Hepatic negative GI ROS, Neg liver ROS,   Endo/Other  negative endocrine ROS  Renal/GU negative Renal ROS  negative genitourinary   Musculoskeletal negative musculoskeletal ROS (+)   Abdominal   Peds  Hematology negative hematology ROS (+)   Anesthesia Other Findings   Reproductive/Obstetrics                          Anesthesia Physical Anesthesia Plan  ASA: II  Anesthesia Plan: General   Post-op Pain Management:    Induction: Intravenous  PONV Risk Score and Plan: 2 and Ondansetron, Dexamethasone, Midazolam and Treatment may vary due to age or medical condition  Airway Management Planned: Oral ETT  Additional Equipment: None  Intra-op Plan:   Post-operative Plan: Extubation in OR  Informed Consent: I have reviewed the patients History and Physical, chart, labs and discussed the procedure including the risks, benefits and alternatives for the proposed anesthesia with the patient or authorized representative who has indicated his/her understanding and acceptance.     Plan Discussed with:   Anesthesia Plan Comments:       Anesthesia Quick Evaluation

## 2018-08-19 NOTE — Interval H&P Note (Signed)
History and Physical Interval Note:  08/19/2018 3:29 PM  Jesse Shelton  has presented today for surgery, with the diagnosis of ACUTE CHOLECYSTITIS  The various methods of treatment have been discussed with the patient and family. After consideration of risks, benefits and other options for treatment, the patient has consented to  Procedure(s): LAPAROSCOPIC CHOLECYSTECTOMY (N/A) as a surgical intervention .  The patient's history has been reviewed, patient examined, no change in status, stable for surgery.  I have reviewed the patient's chart and labs.  Questions were answered to the patient's satisfaction.     Valarie MerinoMatthew B Sharolyn Weber

## 2018-08-19 NOTE — Progress Notes (Signed)
I have reviewed and concur with this student's documentation.   

## 2018-08-19 NOTE — Discharge Instructions (Signed)
[facility] ______[facility] °LAPAROSCOPIC SURGERY: POST OP INSTRUCTIONS °Always review your discharge instruction sheet given to you by the facility where your surgery was performed. °IF YOU HAVE DISABILITY OR FAMILY LEAVE FORMS, YOU MUST BRING THEM TO THE OFFICE FOR PROCESSING.   °DO NOT GIVE THEM TO YOUR DOCTOR. ° °1. A prescription for pain medication may be given to you upon discharge.  Take your pain medication as prescribed, if needed.  If narcotic pain medicine is not needed, then you may take acetaminophen (Tylenol) or ibuprofen (Advil) as needed. °2. Take your usually prescribed medications unless otherwise directed. °3. If you need a refill on your pain medication, please contact your pharmacy.  They will contact our office to request authorization. Prescriptions will not be filled after 5pm or on week-ends. °4. You should follow a light diet the first few days after arrival home, such as soup and crackers, etc.  Be sure to include lots of fluids daily. °5. Most patients will experience some swelling and bruising in the area of the incisions.  Ice packs will help.  Swelling and bruising can take several days to resolve.  °6. It is common to experience some constipation if taking pain medication after surgery.  Increasing fluid intake and taking a stool softener (such as Colace) will usually help or prevent this problem from occurring.  A mild laxative (Milk of Magnesia or Miralax) should be taken according to package instructions if there are no bowel movements after 48 hours. °7. Unless discharge instructions indicate otherwise, you may remove your bandages 24-48 hours after surgery, and you may shower at that time.  You may have steri-strips (small skin tapes) in place directly over the incision.  These strips should be left on the skin for 7-10 days.  If your surgeon used skin glue on the incision, you may shower in 24 hours.  The glue will flake off over the next 2-3 weeks.  Any sutures or  staples will be removed at the office during your follow-up visit. °8. ACTIVITIES:  You may resume regular (light) daily activities beginning the next day--such as daily self-care, walking, climbing stairs--gradually increasing activities as tolerated.  You may have sexual intercourse when it is comfortable.  Refrain from any heavy lifting or straining until approved by your doctor. °a. You may drive when you are no longer taking prescription pain medication, you can comfortably wear a seatbelt, and you can safely maneuver your car and apply brakes. °b. RETURN TO WORK:  __________________________________________________________ °9. You should see your doctor in the office for a follow-up appointment approximately 2-3 weeks after your surgery.  Make sure that you call for this appointment within a day or two after you arrive home to insure a convenient appointment time. °10. OTHER INSTRUCTIONS: __________________________________________________________________________________________________________________________ __________________________________________________________________________________________________________________________ °WHEN TO CALL YOUR DOCTOR: °1. Fever over 101.0 °2. Inability to urinate °3. Continued bleeding from incision. °4. Increased pain, redness, or drainage from the incision. °5. Increasing abdominal pain ° °The clinic staff is available to answer your questions during regular business hours.  Please don’t hesitate to call and ask to speak to one of the nurses for clinical concerns.  If you have a medical emergency, go to the nearest emergency room or call 911.  A surgeon from [facility] is always on call at the hospital. °[address], [address], [place], [place]  [address] ? [address] [address], [place], [place]   [address] °[phone] ? [phone] ? FAX [phone] °Web site:   [email or web address] °

## 2018-08-19 NOTE — Anesthesia Procedure Notes (Signed)
Procedure Name: Intubation Date/Time: 08/19/2018 3:53 PM Performed by: Montel Clock, CRNA Pre-anesthesia Checklist: Patient identified, Emergency Drugs available, Suction available, Patient being monitored and Timeout performed Patient Re-evaluated:Patient Re-evaluated prior to induction Oxygen Delivery Method: Circle system utilized Preoxygenation: Pre-oxygenation with 100% oxygen Induction Type: IV induction Ventilation: Mask ventilation without difficulty and Oral airway inserted - appropriate to patient size Laryngoscope Size: Mac and 3 Grade View: Grade II Tube type: Oral Tube size: 7.5 mm Number of attempts: 1 Airway Equipment and Method: Stylet Placement Confirmation: ETT inserted through vocal cords under direct vision,  positive ETCO2 and breath sounds checked- equal and bilateral Secured at: 23 cm Tube secured with: Tape Dental Injury: Teeth and Oropharynx as per pre-operative assessment  Comments: Grade IIb view. ETT easily passed with downward laryngeal pressure.

## 2018-08-19 NOTE — Transfer of Care (Signed)
Immediate Anesthesia Transfer of Care Note  Patient: Jesse Shelton  Procedure(s) Performed: LAPAROSCOPIC CHOLECYSTECTOMY (N/A Abdomen)  Patient Location: PACU  Anesthesia Type:General  Level of Consciousness: drowsy  Airway & Oxygen Therapy: Patient Spontanous Breathing and Patient connected to face mask oxygen  Post-op Assessment: Report given to RN and Post -op Vital signs reviewed and stable  Post vital signs: Reviewed and stable  Last Vitals:  Vitals Value Taken Time  BP 155/91 08/19/2018  6:00 PM  Temp 37.8 C 08/19/2018  6:00 PM  Pulse 114 08/19/2018  6:02 PM  Resp 11 08/19/2018  6:02 PM  SpO2 100 % 08/19/2018  6:02 PM  Vitals shown include unvalidated device data.  Last Pain:  Vitals:   08/19/18 1454  TempSrc: Oral  PainSc:          Complications: No apparent anesthesia complications

## 2018-08-20 ENCOUNTER — Encounter (HOSPITAL_COMMUNITY): Payer: Self-pay | Admitting: Surgery

## 2018-08-20 LAB — COMPREHENSIVE METABOLIC PANEL
ALBUMIN: 3.8 g/dL (ref 3.5–5.0)
ALK PHOS: 57 U/L (ref 38–126)
ALT: 32 U/L (ref 0–44)
AST: 31 U/L (ref 15–41)
Anion gap: 10 (ref 5–15)
BILIRUBIN TOTAL: 0.8 mg/dL (ref 0.3–1.2)
BUN: 10 mg/dL (ref 6–20)
CO2: 25 mmol/L (ref 22–32)
Calcium: 9.1 mg/dL (ref 8.9–10.3)
Chloride: 106 mmol/L (ref 98–111)
Creatinine, Ser: 1.22 mg/dL (ref 0.61–1.24)
GFR calc Af Amer: >60 mL/min (ref >60)
GFR calc non Af Amer: >60 mL/min (ref >60)
GLUCOSE: 135 mg/dL — AB (ref 70–99)
POTASSIUM: 3.8 mmol/L (ref 3.5–5.1)
Sodium: 141 mmol/L (ref 135–145)
TOTAL PROTEIN: 7.6 g/dL (ref 6.5–8.1)

## 2018-08-20 LAB — CBC
HCT: 46.3 % (ref 39.0–52.0)
Hemoglobin: 15.1 g/dL (ref 13.0–17.0)
MCH: 31.2 pg (ref 26.0–34.0)
MCHC: 32.6 g/dL (ref 30.0–36.0)
MCV: 95.7 fL (ref 80.0–100.0)
PLATELETS: 369 10*3/uL (ref 150–400)
RBC: 4.84 MIL/uL (ref 4.22–5.81)
RDW: 13.1 % (ref 11.5–15.5)
WBC: 15 10*3/uL — ABNORMAL HIGH (ref 4.0–10.5)
nRBC: 0 % (ref 0.0–0.2)

## 2018-08-20 MED ORDER — SODIUM CHLORIDE 0.9 % IV SOLN: INTRAVENOUS | Status: DC | PRN

## 2018-08-20 MED ORDER — OXYCODONE HCL 5 MG PO TABS: 5.0000 mg | ORAL_TABLET | Freq: Four times a day (QID) | ORAL | 0 refills | Status: DC | PRN

## 2018-08-20 MED ORDER — OXYCODONE HCL 5 MG PO TABS: 5.0000 mg | ORAL_TABLET | Freq: Four times a day (QID) | ORAL | Status: DC | PRN

## 2018-08-20 NOTE — Discharge Summary (Signed)
[facility] Discharge Summary   Patient ID: Jesse HollerQuentin D Shelton MRN: [id number] DOB/AGE: Feb 16, 1981 37 y.o.  Admit date: 08/18/2018 Discharge date: 08/20/2018  Admitting Diagnosis: Acute cholecystitis   Discharge Diagnosis Patient Active Problem List   Diagnosis Date Noted  . Acute cholecystitis 08/18/2018  . High risk sexual behavior 11/10/2017  . Depression, major, recurrent, moderate (HCC) 07/10/2016    Class: Chronic  . Pre-diabetes 10/25/2015  . OSA (obstructive sleep apnea) 02/14/2015  . Genital herpes 10/18/2014  . 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 Morbid obesity (HCC) 10/18/2014    Consultants none  Imaging: Koreas Abdomen Limited Ruq  Result Date: 08/18/2018 CLINICAL DATA:  Upper abdominal pain EXAM: ULTRASOUND ABDOMEN LIMITED RIGHT UPPER QUADRANT COMPARISON:  CT abdomen and pelvis August 18, 2018 FINDINGS: Gallbladder: There is a combination of sludge and cholelithiasis within the gallbladder. The largest individual gallstone measures 6 mm in length. There is gallbladder wall thickening and edema with trace pericholecystic fluid. No sonographic Murphy sign noted by sonographer. Common bile duct: Diameter: 5 mm. No intrahepatic or extrahepatic biliary duct dilatation. Liver: No focal lesion identified. Within normal limits in parenchymal echogenicity. Portal vein is patent on color Doppler imaging with normal direction of blood flow towards the liver. There is trace ascites adjacent to the liver. IMPRESSION: Cholelithiasis and sludge within the gallbladder. Gallbladder wall is thickened and edematous with mild pericholecystic fluid. These are findings indicative of acute cholecystitis. Trace ascites adjacent to the liver noted. Electronically Signed   By: Bretta BangWilliam  Woodruff III M.D.   On: 08/18/2018 11:40    Procedures Dr. Daphine DeutscherMartin (08/19/18) - Laparoscopic Cholecystectomy   HPI: Pt is a 37 yo male with a hx of OSA on CPAP,  pre-diabetic, obesity, depression and anxiety who presented to the ED with complaints of abdominal pain. Pt states pain started on Monday in the mid abdomen and progressively worsened to severe, constant, sharp, non radiating, not affected by food. Associated nausea and vomiting. No fever, chill, CP, SOB. No other associated symptoms. Spoke to wife over the phone. Previous abdominal surgeries are a ventral hernia repair. No anticoagulation.  US: Cholelithiasis and sludge within the gallbladder. Gallbladder wall is thickened and edematous with mild pericholecystic fluid. These are findings indicative of acute cholecystitis Labs: WBC 12.8, Lipase 109, Glucose 140  Hospital Course:  Workup showed cholecystitis.  Patient was admitted and underwent procedure listed above.  Tolerated procedure well and was transferred to the floor.  Diet was advanced as tolerated.  On POD#1, the patient was voiding well, tolerating diet, ambulating well, pain well controlled, vital signs stable, incisions c/d/i and felt stable for discharge home.  Patient will follow up as outlined below and knows to call with questions or concerns.     Patient was discharged in good condition.  The [place] Substance controlled database was reviewed prior to prescribing narcotic pain medication to this patient.  Physical Exam: General:  Alert, NAD, pleasant, cooperative Cardio: RRR, S1 & S2 normal, no murmur, rubs, gallops Resp: Effort normal, lungs CTA bilaterally, no wheezes, rales, rhonchi Abd:  Soft, ND, normal bowel sounds, mild generalized tenderness without guarding. No peritonitis. Incisions with glue intact appear well without drainage  Skin: warm and dry  Allergies as of 08/20/2018      Reactions   Penicillins Other (See Comments)   Has patient had a PCN reaction causing immediate rash, facial/tongue/throat swelling, SOB or lightheadedness with hypotension: Unknown Has patient had a PCN reaction causing severe  rash involving mucus membranes or skin necrosis: Unknown Has patient had a PCN reaction that required hospitalization: No Has patient had a PCN reaction occurring within the last 10 years: No If all of the above answers are "NO", then may proceed with Cephalosporin use.      Medication List    TAKE these medications   buPROPion 300 MG 24 hr tablet Commonly known as:  WELLBUTRIN XL Take 1 tablet (300 mg total) by mouth daily.   mirtazapine 30 MG tablet Commonly known as:  REMERON TAKE 1 TABLET BY MOUTH AT  BEDTIME   oxyCODONE 5 MG immediate release tablet Commonly known as:  Oxy IR/ROXICODONE Take 1 tablet (5 mg total) by mouth every 6 (six) hours as needed for severe pain.   sildenafil 50 MG tablet Commonly known as:  VIAGRA Take 1 tablet (50 mg total) by mouth as needed for erectile dysfunction. Reported on 10/24/2015   valACYclovir 1000 MG tablet Commonly known as:  VALTREX Take 1 tablet (1,000 mg total) by mouth daily.        Follow-up Information    [facility] on 08/30/2018.   Specialty:  General Surgery Why:  Your appointment is at 08/30/18 at 8:45 am.  Be at the office 30 minutes early for check in.  Bring photo ID and insurance information.   Contact information: [address] [place] [address] [phone]           Signed: Joyce Copa [facility] 08/20/2018, 10:38 AM Pager: [phone] Consults: [phone] Mon-Fri 7:00 am-4:30 pm Sat-Sun 7:00 am-11:30 am

## 2018-08-20 NOTE — Progress Notes (Signed)
Pt alert, oriented, not really eating a lot.  Passed gas and said he felt better.  D/C instructions, letter for work, and prescription was given.  Pt was d/cd home.

## 2018-11-10 ENCOUNTER — Ambulatory Visit (INDEPENDENT_AMBULATORY_CARE_PROVIDER_SITE_OTHER): Payer: PRIVATE HEALTH INSURANCE | Admitting: Family Medicine

## 2018-11-10 VITALS — BP 105/80 | HR 82 | Temp 97.9°F | Wt 240.0 lb

## 2018-11-10 DIAGNOSIS — N529 Male erectile dysfunction, unspecified: Secondary | ICD-10-CM

## 2018-11-10 DIAGNOSIS — F329 Major depressive disorder, single episode, unspecified: Secondary | ICD-10-CM | POA: Insufficient documentation

## 2018-11-10 DIAGNOSIS — F411 Generalized anxiety disorder: Secondary | ICD-10-CM | POA: Diagnosis not present

## 2018-11-10 DIAGNOSIS — R7303 Prediabetes: Secondary | ICD-10-CM | POA: Diagnosis not present

## 2018-11-10 LAB — POCT GLYCOSYLATED HEMOGLOBIN (HGB A1C): Hemoglobin A1C: 6.4 % — AB (ref 4.0–5.6)

## 2018-11-10 MED ORDER — SILDENAFIL CITRATE 100 MG PO TABS: ORAL_TABLET | ORAL | 0 refills | Status: DC

## 2018-11-10 MED ORDER — METFORMIN HCL ER 750 MG PO TB24: 750.0000 mg | ORAL_TABLET | Freq: Every day | ORAL | 0 refills | Status: DC

## 2018-11-10 MED ORDER — BUPROPION HCL ER (XL) 300 MG PO TB24: 300.0000 mg | ORAL_TABLET | Freq: Every day | ORAL | 3 refills | Status: DC

## 2018-11-10 MED FILL — SILDENAFIL CITRATE 100 MG T: 100 | 30 days supply | Qty: 6 | Fill #0

## 2018-11-10 MED FILL — metFORMIN HCL ER 750 MG TB2: 750 | 90 days supply | Qty: 90 | Fill #0

## 2018-11-10 MED FILL — buPROPion HCL ER (XL) 300 M: 300 | 90 days supply | Qty: 90 | Fill #0

## 2018-11-10 NOTE — Patient Instructions (Addendum)
Thank you for coming in to see [place] today. Please see below to review our plan for today's visit.  1.  We will start you on metformin today.  Take 1 tablet daily.  We will follow-up in 1 month to determine how you are doing.  The most important thing is sure lifestyle modifications including regular exercise and a diet which restricts carbohydrates.,  Carbohydrates include pastas, breads, rice, potatoes, and sugary beverages such as sodas, juice, and tea.  Is also important to avoid excessive portions when eating. 2.  I am pleased to hear you are motivated to see a therapist again and restart your Wellbutrin.  I called in a refill.  Take as prescribed and follow-up with me in 1 month. 3.  I will call you if there are any abnormalities to your labs within 48 hours, otherwise you should receive results in the mail.  Please call the clinic at [phone] if your symptoms worsen or you have any concerns. It was our pleasure to serve you.  Durward Parcel, DO [facility], PGY-3

## 2018-11-10 NOTE — Progress Notes (Signed)
Subjective   Patient ID: Jesse Shelton    DOB: 03-Feb-1981, 38 y.o. male   MRN: [id number]  CC: "Routine checkup"  HPI: Jesse Shelton is a 38 y.o. male who presents to clinic today for the following:  Prediabetes: Patient has history of prediabetes with a last A1c of 6.4 last year.  He reports a history of diabetes with his maternal aunt.  He is open to starting medication today but is motivated to pursue lifestyle modifications at this time.  He does not exercise but tries to avoid "fatty foods."  Anxiety and depression: History of Wellbutrin extended release until about 2 years ago when patient self discontinued due to lack of motivation.  He does report symptoms of anhedonia and has anxiety about "everything."  He is open to restarting his medication and has been on Prozac before but would like to avoid SSRIs due to the sexual side effects.  She denies SI/HI or history of such.  He has been [facility] by a [facility] years ago but is looking for a new therapist in the [place] area based on his insurance.  Erectile dysfunction: Patient continues to have difficulty with achieving erections.  He does report erections in the morning.  He has been using sildenafil 50 mg daily with some improvement but would like to increase the dose today.  He has no history of chest pain or nitrate use.  ROS: see HPI for pertinent.  PMFSH: Pre-diabetes, ED, morbid obesity, OSA, MDD.  Surgical history hernia, right elbow, bone marrow harvest.  Family history unremarkable.  Smoking status reviewed. Medications reviewed.  Objective   BP 105/80   Pulse 82   Temp 97.9 F (36.6 C) (Oral)   Wt 240 lb (108.9 kg)   SpO2 100%   BMI 36.49 kg/m  Vitals and nursing note reviewed.  General: well nourished, well developed, NAD with non-toxic appearance HEENT: normocephalic, atraumatic, moist mucous membranes Neck: supple, non-tender without lymphadenopathy Cardiovascular:  regular rate and rhythm without murmurs, rubs, or gallops Lungs: clear to auscultation bilaterally with normal work of breathing Abdomen: soft, non-tender, non-distended, normoactive bowel sounds Skin: warm, dry, no rashes or lesions, cap refill < 2 seconds Extremities: warm and well perfused, normal tone, no edema  Assessment & Plan   Pre-diabetes Remains at prediabetic range, A1c 6.4.  No known hypertension or hyperlipidemia. - Discussed lifestyle modifications including diet and exercise - Initiating metformin XR 750 mg daily - RTC 1 month or sooner if needed  GAD (generalized anxiety disorder) Combined anxiety and depression.  Appears anxiety is playing a more significant role based on interview.  Gad 7 score 12, somewhat difficult.  PHQ 9 score 7, somewhat difficult.  Interested in combined pharmacotherapy and behavioral therapy.  History of Wellbutrin use.  Does not appear to be a threat to self or others.  No history of substance use disorder aside from cigarettes. - Given refill for Wellbutrin XL 300 mg daily - Encourage patient to contact local behavioral therapist of his choice in [place] - Reviewed return precautions  ED (erectile dysfunction) of organic origin Prediabetes may be playing a role, however untreated depression anxiety seem to be making a bigger impact.  Able to achieve erection.  No history of nitrates or chest pain. - Increasing sildenafil to 100 mg daily as needed  Orders Placed This Encounter  Procedures  . CBC  . Comprehensive metabolic panel    Order Specific Question:   Has the patient  fasted?    Answer:   No  . Lipid Panel    Order Specific Question:   Has the patient fasted?    Answer:   No  . Urinalysis, Routine w reflex microscopic  . Microalbumin/Creatinine Ratio, Urine  . TSH  . HgB A1c   Meds ordered this encounter  Medications  . sildenafil (VIAGRA) 100 MG tablet    Sig: TAKE 1 TABLET 1 HOUR BEFORE INTERCOURSE DAILY AS NEEDED     Dispense:  20 tablet    Refill:  0  . buPROPion (WELLBUTRIN XL) 300 MG 24 hr tablet    Sig: Take 1 tablet (300 mg total) by mouth daily.    Dispense:  90 tablet    Refill:  3  . metFORMIN (GLUCOPHAGE XR) 750 MG 24 hr tablet    Sig: Take 1 tablet (750 mg total) by mouth daily with breakfast.    Dispense:  90 tablet    Refill:  0    Durward Parcelavid , DO [facility], PGY-3 11/10/2018, 1:50 PM

## 2018-11-10 NOTE — Assessment & Plan Note (Signed)
Remains at prediabetic range, A1c 6.4.  No known hypertension or hyperlipidemia. - Discussed lifestyle modifications including diet and exercise - Initiating metformin XR 750 mg daily - RTC 1 month or sooner if needed

## 2018-11-10 NOTE — Assessment & Plan Note (Signed)
Prediabetes may be playing a role, however untreated depression anxiety seem to be making a bigger impact.  Able to achieve erection.  No history of nitrates or chest pain. - Increasing sildenafil to 100 mg daily as needed

## 2018-11-10 NOTE — Assessment & Plan Note (Signed)
Combined anxiety and depression.  Appears anxiety is playing a more significant role based on interview.  Gad 7 score 12, somewhat difficult.  PHQ 9 score 7, somewhat difficult.  Interested in combined pharmacotherapy and behavioral therapy.  History of Wellbutrin use.  Does not appear to be a threat to self or others.  No history of substance use disorder aside from cigarettes. - Given refill for Wellbutrin XL 300 mg daily - Encourage patient to contact local behavioral therapist of his choice in [place] - Reviewed return precautions

## 2018-11-11 ENCOUNTER — Encounter: Payer: Self-pay | Admitting: Family Medicine

## 2018-11-11 LAB — COMPREHENSIVE METABOLIC PANEL
ALT: 19 IU/L (ref 0–44)
AST: 12 IU/L (ref 0–40)
Albumin/Globulin Ratio: 1.6 (ref 1.2–2.2)
Albumin: 4.2 g/dL (ref 4.0–5.0)
Alkaline Phosphatase: 77 IU/L (ref 39–117)
BUN/Creatinine Ratio: 8 — ABNORMAL LOW (ref 9–20)
BUN: 8 mg/dL (ref 6–20)
Bilirubin Total: 0.4 mg/dL (ref 0.0–1.2)
CALCIUM: 9.6 mg/dL (ref 8.7–10.2)
CO2: 23 mmol/L (ref 20–29)
Chloride: 104 mmol/L (ref 96–106)
Creatinine, Ser: 1.04 mg/dL (ref 0.76–1.27)
GFR calc non Af Amer: 93 mL/min/{1.73_m2} (ref >59)
GFR, EST AFRICAN AMERICAN: 107 mL/min/{1.73_m2} (ref >59)
Globulin, Total: 2.6 g/dL (ref 1.5–4.5)
Glucose: 101 mg/dL — ABNORMAL HIGH (ref 65–99)
Potassium: 4.4 mmol/L (ref 3.5–5.2)
Sodium: 139 mmol/L (ref 134–144)
TOTAL PROTEIN: 6.8 g/dL (ref 6.0–8.5)

## 2018-11-11 LAB — LIPID PANEL
CHOL/HDL RATIO: 4.4 ratio (ref 0.0–5.0)
Cholesterol, Total: 173 mg/dL (ref 100–199)
HDL: 39 mg/dL — ABNORMAL LOW (ref >39)
LDL Calculated: 112 mg/dL — ABNORMAL HIGH (ref 0–99)
Triglycerides: 112 mg/dL (ref 0–149)
VLDL Cholesterol Cal: 22 mg/dL (ref 5–40)

## 2018-11-11 LAB — CBC
Hematocrit: 45.2 % (ref 37.5–51.0)
Hemoglobin: 15.6 g/dL (ref 13.0–17.7)
MCH: 30.9 pg (ref 26.6–33.0)
MCHC: 34.5 g/dL (ref 31.5–35.7)
MCV: 90 fL (ref 79–97)
Platelets: 403 10*3/uL (ref 150–450)
RBC: 5.05 x10E6/uL (ref 4.14–5.80)
RDW: 13.8 % (ref 11.6–15.4)
WBC: 6.8 10*3/uL (ref 3.4–10.8)

## 2018-11-11 LAB — MICROALBUMIN / CREATININE URINE RATIO
CREATININE, UR: 143.7 mg/dL
Microalb/Creat Ratio: <2 mg/g creat (ref 0–29)
Microalbumin, Urine: <3 ug/mL

## 2018-11-11 LAB — TSH: TSH: 0.603 u[IU]/mL (ref 0.450–4.500)

## 2018-12-07 ENCOUNTER — Ambulatory Visit (INDEPENDENT_AMBULATORY_CARE_PROVIDER_SITE_OTHER): Payer: No Typology Code available for payment source | Admitting: Psychology

## 2018-12-07 DIAGNOSIS — F3341 Major depressive disorder, recurrent, in partial remission: Secondary | ICD-10-CM

## 2018-12-13 ENCOUNTER — Ambulatory Visit (INDEPENDENT_AMBULATORY_CARE_PROVIDER_SITE_OTHER): Payer: No Typology Code available for payment source | Admitting: Psychology

## 2018-12-13 DIAGNOSIS — F319 Bipolar disorder, unspecified: Secondary | ICD-10-CM | POA: Diagnosis not present

## 2018-12-22 ENCOUNTER — Ambulatory Visit (INDEPENDENT_AMBULATORY_CARE_PROVIDER_SITE_OTHER): Payer: No Typology Code available for payment source | Admitting: Psychology

## 2018-12-22 DIAGNOSIS — F3341 Major depressive disorder, recurrent, in partial remission: Secondary | ICD-10-CM

## 2018-12-29 ENCOUNTER — Ambulatory Visit (INDEPENDENT_AMBULATORY_CARE_PROVIDER_SITE_OTHER): Payer: No Typology Code available for payment source | Admitting: Psychology

## 2018-12-29 DIAGNOSIS — F319 Bipolar disorder, unspecified: Secondary | ICD-10-CM

## 2019-01-05 ENCOUNTER — Ambulatory Visit (INDEPENDENT_AMBULATORY_CARE_PROVIDER_SITE_OTHER): Payer: No Typology Code available for payment source | Admitting: Psychology

## 2019-01-05 DIAGNOSIS — F3341 Major depressive disorder, recurrent, in partial remission: Secondary | ICD-10-CM | POA: Diagnosis not present

## 2019-01-12 ENCOUNTER — Ambulatory Visit (INDEPENDENT_AMBULATORY_CARE_PROVIDER_SITE_OTHER): Payer: No Typology Code available for payment source | Admitting: Psychology

## 2019-01-12 DIAGNOSIS — F3341 Major depressive disorder, recurrent, in partial remission: Secondary | ICD-10-CM

## 2019-01-17 ENCOUNTER — Ambulatory Visit (INDEPENDENT_AMBULATORY_CARE_PROVIDER_SITE_OTHER): Payer: No Typology Code available for payment source | Admitting: Psychology

## 2019-01-17 DIAGNOSIS — F3289 Other specified depressive episodes: Secondary | ICD-10-CM | POA: Diagnosis not present

## 2019-01-19 ENCOUNTER — Ambulatory Visit (INDEPENDENT_AMBULATORY_CARE_PROVIDER_SITE_OTHER): Payer: No Typology Code available for payment source | Admitting: Psychology

## 2019-01-19 DIAGNOSIS — F3341 Major depressive disorder, recurrent, in partial remission: Secondary | ICD-10-CM

## 2019-02-01 ENCOUNTER — Ambulatory Visit (INDEPENDENT_AMBULATORY_CARE_PROVIDER_SITE_OTHER): Payer: No Typology Code available for payment source | Admitting: Psychology

## 2019-02-01 DIAGNOSIS — F3341 Major depressive disorder, recurrent, in partial remission: Secondary | ICD-10-CM | POA: Diagnosis not present

## 2019-02-08 ENCOUNTER — Other Ambulatory Visit: Payer: Self-pay

## 2019-02-08 ENCOUNTER — Other Ambulatory Visit (HOSPITAL_COMMUNITY)
Admission: RE | Admit: 2019-02-08 | Discharge: 2019-02-08 | Disposition: A | Discharge status: Home / Self Care | Payer: PRIVATE HEALTH INSURANCE | Source: Ambulatory Visit | Attending: Family Medicine | Admitting: Family Medicine

## 2019-02-08 ENCOUNTER — Ambulatory Visit (INDEPENDENT_AMBULATORY_CARE_PROVIDER_SITE_OTHER): Payer: PRIVATE HEALTH INSURANCE | Admitting: Family Medicine

## 2019-02-08 ENCOUNTER — Encounter: Payer: Self-pay | Admitting: Family Medicine

## 2019-02-08 VITALS — BP 122/80 | HR 99 | Wt 240.0 lb

## 2019-02-08 DIAGNOSIS — B009 Herpesviral infection, unspecified: Secondary | ICD-10-CM | POA: Diagnosis not present

## 2019-02-08 DIAGNOSIS — Z113 Encounter for screening for infections with a predominantly sexual mode of transmission: Secondary | ICD-10-CM | POA: Insufficient documentation

## 2019-02-08 DIAGNOSIS — N529 Male erectile dysfunction, unspecified: Secondary | ICD-10-CM

## 2019-02-08 DIAGNOSIS — R7303 Prediabetes: Secondary | ICD-10-CM

## 2019-02-08 LAB — POCT GLYCOSYLATED HEMOGLOBIN (HGB A1C): HbA1c, POC (controlled diabetic range): 6.5 % (ref 0.0–7.0)

## 2019-02-08 MED ORDER — SILDENAFIL CITRATE 100 MG PO TABS: ORAL_TABLET | ORAL | 0 refills | Status: DC

## 2019-02-08 MED ORDER — VALACYCLOVIR HCL 1 G PO TABS: ORAL_TABLET | ORAL | 3 refills | Status: DC

## 2019-02-08 MED ORDER — METFORMIN HCL ER 750 MG PO TB24: 1500.0000 mg | ORAL_TABLET | Freq: Every day | ORAL | 0 refills | Status: DC

## 2019-02-08 NOTE — Progress Notes (Signed)
Subjective   Patient ID: Jesse Shelton    DOB: September 17, 1981, 38 y.o. male   MRN: [id number]  CC: "Prediabetes follow-up"  HPI: Jesse Shelton is a 38 y.o. male who presents to clinic today for the following:  Prediabetes: Mr. Delorise Royals has a history of elevated A1c in the prediabetic range and was recently started on metformin extended release 750 mg approximately 3 months ago.  He reports good adherence and denies any GI side effects.  Denies polyuria, polydipsia, polyphagia, leg swelling, change in vision, nausea or vomiting.  STD screening: Reports continue male sexual partners.  No male partners.  Does not use condoms.  Denies rash, penile discharge, testicular pain, sore throat, joint pain.  Recurrent genital herpes: Initial outbreak back in college with last outbreak approximately 5 years ago.  Continues to take 1 g "on an as-needed basis" with his last dose approximately 2-3 weeks.  He does not take these based on his symptoms.  He also does not take these on a daily basis.  Erectile dysfunction: Reports adequate erection since increasing sildenafil to 100 mg daily.  Needs a refill.  Does not have side effects of headache.  Not currently on nitrates.  ROS: see HPI for pertinent.  PMFSH: Reviewed. Smoking status reviewed. Medications reviewed.  Objective   BP 122/80   Pulse 99   Wt 240 lb (108.9 kg)   SpO2 98%   BMI 36.49 kg/m  Vitals and nursing note reviewed.  General: well nourished, well developed, NAD with non-toxic appearance HEENT: normocephalic, atraumatic, moist mucous membranes without lesions or erythematous oropharynx Neck: supple, non-tender without lymphadenopathy Cardiovascular: regular rate and rhythm without murmurs, rubs, or gallops Lungs: clear to auscultation bilaterally with normal work of breathing GU: uncircumcised penis easily retracted without lesions on glans or shaft, no penile discharge, there are several piercings without signs of  erythema or induration, no testicular tenderness or mass, no palpable hernia Skin: warm, dry, no rashes or lesions, cap refill < 2 seconds Extremities: warm and well perfused, normal tone, no edema  Assessment & Plan   Pre-diabetes A1c up 6.5.  Tolerating metformin XR but suboptimal dose.  Does meet criteria for morbid obesity. - Increasing metformin to 1500 mg daily - Discussed lifestyle modifications - RTC 3 months for next A1c  ED (erectile dysfunction) of organic origin Chronic.  Suspect prediabetes may be playing a role along with OSA and obesity. - Refill for sildenafil 100 mg daily as needed  Herpes History of recurrent herpes with no recent outbreak in years.  Currently on daily prophylactic maintenance dose, however patient is not taking as directed. - Instructed to take during presence of signs or symptoms with 1 g daily for 5 days, medication instructions updated to reflect this  Screen for STD (sexually transmitted disease) Asymptomatic.  Does not use protection.  History of chlamydia in the past with adequate treatment. - Checking GC/chlamydia or trichomonas, HIV, RPR  Orders Placed This Encounter  Procedures  . HIV antibody (with reflex)  . RPR  . HgB A1c   Meds ordered this encounter  Medications  . metFORMIN (GLUCOPHAGE XR) 750 MG 24 hr tablet    Sig: Take 2 tablets (1,500 mg total) by mouth daily with breakfast.    Dispense:  180 tablet    Refill:  0  . sildenafil (VIAGRA) 100 MG tablet    Sig: TAKE 1 TABLET 1 HOUR BEFORE INTERCOURSE DAILY AS NEEDED    Dispense:  20 tablet  Refill:  0  . valACYclovir (VALTREX) 1000 MG tablet    Sig: TAKE 1 G DAILY FOR 5 DAYS WITHIN 1 DAY OF LESION AS NEEDED.    Dispense:  90 tablet    Refill:  3    Durward Parcelavid , DO [facility], PGY-3 02/08/2019, 9:28 AM

## 2019-02-08 NOTE — Assessment & Plan Note (Signed)
Asymptomatic.  Does not use protection.  History of chlamydia in the past with adequate treatment. - Checking GC/chlamydia or trichomonas, HIV, RPR

## 2019-02-08 NOTE — Assessment & Plan Note (Signed)
A1c up 6.5.  Tolerating metformin XR but suboptimal dose.  Does meet criteria for morbid obesity. - Increasing metformin to 1500 mg daily - Discussed lifestyle modifications - RTC 3 months for next A1c

## 2019-02-08 NOTE — Patient Instructions (Signed)
Thank you for coming in to see [place] today. Please see below to review our plan for today's visit.  1.  It is important to use protection condom use during every episode of intercourse to prevent sexually transmitted infections and HIV.  I will call you if there is any abnormalities to your labs within 48 hours, otherwise you should receive a message via MyChart. 2.  Increase your metformin to 2 tablets (1500 mg) daily with breakfast.  We will recheck your A1c in 3 months. 3.  I called in a refill for your sildenafil.   4.  Given your herpes is well controlled, would discontinue the Valtrex unless he develops symptoms or a rash at which point take 1 g daily for 5 days as needed.  I adjusted your medications to reflect the change in your dosage instructions.  Please call the clinic at [phone] if your symptoms worsen or you have any concerns. It was our pleasure to serve you.  Durward Parcel, DO [facility], PGY-3

## 2019-02-08 NOTE — Assessment & Plan Note (Signed)
Chronic.  Suspect prediabetes may be playing a role along with OSA and obesity. - Refill for sildenafil 100 mg daily as needed

## 2019-02-08 NOTE — Assessment & Plan Note (Signed)
History of recurrent herpes with no recent outbreak in years.  Currently on daily prophylactic maintenance dose, however patient is not taking as directed. - Instructed to take during presence of signs or symptoms with 1 g daily for 5 days, medication instructions updated to reflect this

## 2019-02-09 LAB — URINE CYTOLOGY ANCILLARY ONLY
Chlamydia: NEGATIVE
Neisseria Gonorrhea: NEGATIVE

## 2019-02-09 LAB — RPR: RPR Ser Ql: NONREACTIVE

## 2019-02-09 LAB — HIV ANTIBODY (ROUTINE TESTING W REFLEX): HIV Screen 4th Generation wRfx: NONREACTIVE

## 2019-02-13 ENCOUNTER — Encounter: Payer: Self-pay | Admitting: Family Medicine

## 2019-02-14 ENCOUNTER — Encounter: Payer: Self-pay | Admitting: Family Medicine

## 2019-02-15 ENCOUNTER — Ambulatory Visit: Payer: No Typology Code available for payment source | Admitting: Psychology

## 2019-02-16 ENCOUNTER — Ambulatory Visit (INDEPENDENT_AMBULATORY_CARE_PROVIDER_SITE_OTHER): Payer: No Typology Code available for payment source | Admitting: Psychology

## 2019-02-16 DIAGNOSIS — F3341 Major depressive disorder, recurrent, in partial remission: Secondary | ICD-10-CM

## 2019-02-24 ENCOUNTER — Other Ambulatory Visit: Payer: Self-pay | Admitting: Family Medicine

## 2019-02-24 DIAGNOSIS — R7303 Prediabetes: Secondary | ICD-10-CM

## 2019-02-24 DIAGNOSIS — N529 Male erectile dysfunction, unspecified: Secondary | ICD-10-CM

## 2019-02-25 MED ORDER — METFORMIN HCL ER 750 MG PO TB24: 1500.0000 mg | ORAL_TABLET | Freq: Every day | ORAL | 0 refills | Status: DC

## 2019-02-25 MED ORDER — SILDENAFIL CITRATE 100 MG PO TABS: ORAL_TABLET | ORAL | 0 refills | Status: DC

## 2019-03-03 ENCOUNTER — Ambulatory Visit (INDEPENDENT_AMBULATORY_CARE_PROVIDER_SITE_OTHER): Payer: No Typology Code available for payment source | Admitting: Psychology

## 2019-03-03 DIAGNOSIS — F319 Bipolar disorder, unspecified: Secondary | ICD-10-CM

## 2019-03-17 ENCOUNTER — Ambulatory Visit: Payer: No Typology Code available for payment source | Admitting: Psychology

## 2019-05-11 ENCOUNTER — Other Ambulatory Visit (HOSPITAL_COMMUNITY)
Admission: RE | Admit: 2019-05-11 | Discharge: 2019-05-11 | Disposition: A | Discharge status: Home / Self Care | Payer: PRIVATE HEALTH INSURANCE | Source: Ambulatory Visit | Attending: Family Medicine | Admitting: Family Medicine

## 2019-05-11 ENCOUNTER — Other Ambulatory Visit: Payer: Self-pay

## 2019-05-11 ENCOUNTER — Encounter: Payer: Self-pay | Admitting: Family Medicine

## 2019-05-11 ENCOUNTER — Other Ambulatory Visit: Payer: Self-pay | Admitting: Family Medicine

## 2019-05-11 ENCOUNTER — Ambulatory Visit (INDEPENDENT_AMBULATORY_CARE_PROVIDER_SITE_OTHER): Payer: No Typology Code available for payment source | Admitting: Family Medicine

## 2019-05-11 VITALS — BP 120/70 | HR 72 | Wt 238.8 lb

## 2019-05-11 DIAGNOSIS — Z113 Encounter for screening for infections with a predominantly sexual mode of transmission: Secondary | ICD-10-CM | POA: Insufficient documentation

## 2019-05-11 DIAGNOSIS — Z7251 High risk heterosexual behavior: Secondary | ICD-10-CM

## 2019-05-11 DIAGNOSIS — R7303 Prediabetes: Secondary | ICD-10-CM

## 2019-05-11 DIAGNOSIS — E119 Type 2 diabetes mellitus without complications: Secondary | ICD-10-CM

## 2019-05-11 LAB — POCT GLYCOSYLATED HEMOGLOBIN (HGB A1C): Hemoglobin A1C: 6.6 % — AB (ref 4.0–5.6)

## 2019-05-11 NOTE — Progress Notes (Signed)
   Subjective:   Patient ID: Jesse Shelton    DOB: 10-01-1980, 38 y.o. male   MRN: [id number]  Jesse Shelton is a 38 y.o. male with a history of prediabetes here for A1C check and STD screening  Prediabetes:  Patient has recent diagnosis of prediabetes with most recent A1C 6.5 in May 2020. He is currently treating with Metformin ER 1500mg  QD. He reports good adherence and denies any GI side effects.  Denies polyuria, polydipsia, polyphagia, leg swelling, change in vision, nausea or vomiting. He notes he had his gall bladder removed in November 2019. He notes change in his diet since then. Doesn't eat alot of sugary foods or carbs. Drinks lots of water. Also notes tries to stay clear of fatty foods as well.  STD screening: Reports 4 sexual partners this year, all male. Denies use of condoms. No male partners.  Does not use condoms.  Denies rash, penile discharge, testicular pain, sore throat, joint pain, dysuria. He requests regular screening every 6 months.  Review of Systems:  Per HPI.   [place], medications and smoking status reviewed.  Objective:   BP 120/70   Pulse 72   Wt 238 lb 12.8 oz (108.3 kg)   SpO2 97%   BMI 36.31 kg/m  Vitals and nursing note reviewed.  General: well nourished, well developed, in no acute distress with non-toxic appearance CV: regular rate and rhythm without murmurs, rubs, or gallops, no lower extremity edema Lungs: clear to auscultation bilaterally with normal work of breathing Abdomen: soft, non-tender, non-distended,  normoactive bowel sounds Skin: warm, dry Extremities: warm and well perfused Neuro: Alert and oriented, speech normal  Assessment & Plan:   Type 2 diabetes mellitus without complications (HCC) G6Y up from 6.5 to 6.6 since May 2020. Currently on Metformin 1500mg  QD. Spoke to patient in regards to elevated A1C. He notes he only started metformin to see if it would help and it appears to be making his diabetes worse. Had very  lengthy discussion to explain progression of diabetes, systemic effects of uncontrolled diabetes, and method of action of Metformin. He remained adamant that it was the Metformin worsening his Diabetes. He opted to discontinue the Metformin and follow up in 3 months to have his A1C rechecked. Informed him this was against my recommendations but it is his decision. Highly recommended lifestyle modifications including diet and exercise.  - Patient to follow up in 3 months for repeat A1C.  - Will discuss restarting Metformin at that time if worsening.  Screen for STD (sexually transmitted disease) Asymptomatic. Has mutliple sex partners and does not use protection. H/o Chlamydia in past with adequate treatment. Obtained GC/Chlamydia, trich, HIV, Hep C, and RPR - all results negative. Patient informed.  Orders Placed This Encounter  Procedures  . RPR  . HIV antibody (with reflex)  . Hepatitis c antibody (reflex)  . HCV Comment:  . HgB A1c   Mina Marble, DO PGY-2, [facility] 05/12/2019 11:42 AM

## 2019-05-11 NOTE — Patient Instructions (Addendum)
Thanks so much for coming to see me today. I will call you with your results and changes to your medicines if indicated. Please return in 3 months for another A1C check.   Take care, Dr. Tarry Kos

## 2019-05-12 DIAGNOSIS — E119 Type 2 diabetes mellitus without complications: Secondary | ICD-10-CM | POA: Insufficient documentation

## 2019-05-12 LAB — RPR: RPR Ser Ql: NONREACTIVE

## 2019-05-12 LAB — HCV COMMENT:

## 2019-05-12 LAB — HIV ANTIBODY (ROUTINE TESTING W REFLEX): HIV Screen 4th Generation wRfx: NONREACTIVE

## 2019-05-12 LAB — HEPATITIS C ANTIBODY (REFLEX): HCV Ab: 0.1 s/co ratio (ref 0.0–0.9)

## 2019-05-12 NOTE — Assessment & Plan Note (Signed)
Asymptomatic. Has mutliple sex partners and does not use protection. H/o Chlamydia in past with adequate treatment. Obtained GC/Chlamydia, trich, HIV, Hep C, and RPR - all results negative. Patient informed.

## 2019-05-12 NOTE — Assessment & Plan Note (Addendum)
A1C up from 6.5 to 6.6 since May 2020. Currently on Metformin 1500mg  QD. Spoke to patient in regards to elevated A1C. He notes he only started metformin to see if it would help and it appears to be making his diabetes worse. Had very lengthy discussion to explain progression of diabetes, systemic effects of uncontrolled diabetes, and method of action of Metformin. He remained adamant that it was the Metformin worsening his Diabetes. He opted to discontinue the Metformin and follow up in 3 months to have his A1C rechecked. Informed him this was against my recommendations but it is his decision. Highly recommended lifestyle modifications including diet and exercise.  - Patient to follow up in 3 months for repeat A1C.  - Will discuss restarting Metformin at that time if worsening.

## 2019-05-13 ENCOUNTER — Other Ambulatory Visit: Payer: Self-pay

## 2019-05-13 LAB — URINE CYTOLOGY ANCILLARY ONLY
Chlamydia: NEGATIVE
Neisseria Gonorrhea: NEGATIVE
Trichomonas: NEGATIVE

## 2019-05-16 ENCOUNTER — Encounter: Payer: Self-pay | Admitting: Family Medicine

## 2019-05-24 LAB — CHLAMYDIA/GONOCOCCUS/TRICHOMONAS, NAA
Chlamydia by NAA: NEGATIVE
Gonococcus by NAA: NEGATIVE
Trich vag by NAA: NEGATIVE

## 2019-05-24 LAB — SPECIMEN STATUS REPORT

## 2019-08-03 ENCOUNTER — Encounter: Payer: Self-pay | Admitting: Family Medicine

## 2019-08-03 ENCOUNTER — Ambulatory Visit (INDEPENDENT_AMBULATORY_CARE_PROVIDER_SITE_OTHER): Payer: PRIVATE HEALTH INSURANCE | Admitting: Family Medicine

## 2019-08-03 ENCOUNTER — Other Ambulatory Visit: Payer: Self-pay

## 2019-08-03 VITALS — BP 110/68 | HR 83 | Ht 68.0 in | Wt 242.0 lb

## 2019-08-03 DIAGNOSIS — E119 Type 2 diabetes mellitus without complications: Secondary | ICD-10-CM | POA: Diagnosis not present

## 2019-08-03 LAB — POCT GLYCOSYLATED HEMOGLOBIN (HGB A1C): HbA1c, POC (controlled diabetic range): 6.7 % (ref 0.0–7.0)

## 2019-08-03 NOTE — Patient Instructions (Signed)
It was very nice to meet you today. Please enjoy the rest of your week. Today you were seen for an A1c check which was 6.7 you are at your goal. Continue with diet and exercise. Follow up in 3 months for another A1c check or sooner if needed.   Please call the clinic at [phone] if your symptoms worsen or you have any concerns. It was our pleasure to serve you.

## 2019-08-03 NOTE — Progress Notes (Signed)
   Subjective:    Patient ID: Jesse Shelton, male    DOB: 12/22/1980, 38 y.o.   MRN: [id number]   CC: A1c check  HPI: Jesse Shelton presents today for an A1c check.  He does not have any concerns today denies paresthesia hs, polyuria, polyphagia.  He recently took himself off of Metformin.  He says that he put himself on Metformin just to see if it would help his A1c.  He feels as if it made his A1c increased and so he took himself back off of Metformin.  Today he would like to know what his A1c is off of Metformin.  Smoking status reviewed  Review of Systems Per HPI, also denies recent illness, fever, headache, changes in vision, chest pain, shortness of breath, abdominal pain, N/V/D, weakness   Patient Active Problem List   Diagnosis Date Noted  . Type 2 diabetes mellitus without complications ([place]) 25/85/2778  . Screen for STD (sexually transmitted disease) 02/08/2019  . Herpes 02/08/2019  . Major depressive disorder 11/10/2018  . GAD (generalized anxiety disorder) 11/10/2018  . Depression, major, recurrent, moderate ([place]) 07/10/2016    Class: Chronic  . OSA (obstructive sleep apnea) 02/14/2015  . Genital herpes 10/18/2014  . 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 Morbid obesity ([place]) 10/18/2014     Objective:  There were no vitals taken for this visit. Vitals and nursing note reviewed  General: Appears well, no acute distress. Age appropriate. Sitting in bedside chair. Cardiac: RRR, normal heart sounds, no murmurs Respiratory: CTAB, normal effort Extremities: No edema or cyanosis. Skin: Warm and dry, no rashes noted Neuro: alert and oriented, no focal deficits Psych: normal affect Diabetic Foot Exam - Simple   Simple Foot Form Diabetic Foot exam was performed with the following findings: Yes 08/03/2019  9:31 AM  Visual Inspection No deformities, no ulcerations, no other skin breakdown bilaterally: Yes Sensation Testing Intact to touch and  monofilament testing bilaterally: Yes Pulse Check Posterior Tibialis and Dorsalis pulse intact bilaterally: Yes Comments     Assessment & Plan:    Type 2 diabetes mellitus without complications ([place]) Well-controlled.  A1c today is 6.7 is at goal. He does not need to restart Metformin at this time if he does not desire. -Continue diet and exercise modifications -Return in 3 months for A1c check    Gerlene Fee, [place] PGY-1

## 2019-08-03 NOTE — Assessment & Plan Note (Addendum)
Well-controlled.  A1c today is 6.7 is at goal. He does not need to restart Metformin at this time if he does not desire. -Continue diet and exercise modifications -Return in 3 months for A1c check

## 2019-09-28 ENCOUNTER — Other Ambulatory Visit (HOSPITAL_COMMUNITY)
Admission: RE | Admit: 2019-09-28 | Discharge: 2019-09-28 | Disposition: A | Discharge status: Home / Self Care | Payer: PRIVATE HEALTH INSURANCE | Source: Ambulatory Visit | Attending: Family Medicine | Admitting: Family Medicine

## 2019-09-28 ENCOUNTER — Ambulatory Visit (INDEPENDENT_AMBULATORY_CARE_PROVIDER_SITE_OTHER): Payer: No Typology Code available for payment source | Admitting: Family Medicine

## 2019-09-28 ENCOUNTER — Other Ambulatory Visit: Payer: Self-pay

## 2019-09-28 DIAGNOSIS — Z113 Encounter for screening for infections with a predominantly sexual mode of transmission: Secondary | ICD-10-CM | POA: Diagnosis present

## 2019-09-28 NOTE — Progress Notes (Signed)
   Subjective:    Patient ID: Jesse Shelton, male    DOB: 11/02/1980, 39 y.o.   MRN: [id number]  HPI Patient presents for periodic STD screening.  Denies rash, discharge, fever, joint pains.  States only has sex with women.  No partner is symptomatic.  He is fluent in medical terminology and works at [employer].   Other issues: Already has FU of DM scheduled. Has had flu shot this year. Has had diabetic eye exam - we don't have records.    Review of Systems     Objective:   Physical Exam No skin rash. Abd benign       Assessment & Plan:

## 2019-09-28 NOTE — Assessment & Plan Note (Signed)
I was a bit unclear why he believes he is at high risk for STD.  Given his focus on testing and his medical fluency, I also asked him to research whether he feels if he would benefit from PREP therapy.

## 2019-09-28 NOTE — Patient Instructions (Signed)
I will call with test results.  You have told me that it is OK to leave a message on your answering machine. Please look into PREP therapy (pre exposure prophylaxis for HIV) to see if you would be a good candidate.  We can prescribe if you meet the criteria.

## 2019-09-29 LAB — HIV ANTIBODY (ROUTINE TESTING W REFLEX): HIV Screen 4th Generation wRfx: NONREACTIVE

## 2019-09-29 LAB — RPR: RPR Ser Ql: NONREACTIVE

## 2019-09-30 ENCOUNTER — Encounter: Payer: Self-pay | Admitting: Family Medicine

## 2019-10-03 ENCOUNTER — Other Ambulatory Visit: Payer: Self-pay | Admitting: Family Medicine

## 2019-10-03 DIAGNOSIS — Z113 Encounter for screening for infections with a predominantly sexual mode of transmission: Secondary | ICD-10-CM

## 2019-10-06 ENCOUNTER — Other Ambulatory Visit: Payer: Self-pay

## 2019-10-06 ENCOUNTER — Other Ambulatory Visit (HOSPITAL_COMMUNITY)
Admission: RE | Admit: 2019-10-06 | Discharge: 2019-10-06 | Disposition: A | Discharge status: Home / Self Care | Payer: PRIVATE HEALTH INSURANCE | Source: Ambulatory Visit | Attending: Family Medicine | Admitting: Family Medicine

## 2019-10-06 ENCOUNTER — Other Ambulatory Visit: Payer: No Typology Code available for payment source

## 2019-10-06 DIAGNOSIS — Z113 Encounter for screening for infections with a predominantly sexual mode of transmission: Secondary | ICD-10-CM | POA: Insufficient documentation

## 2019-10-06 DIAGNOSIS — A749 Chlamydial infection, unspecified: Secondary | ICD-10-CM

## 2019-10-06 NOTE — Progress Notes (Signed)
Urine cytology sample collected and placed in pick up box for cytology courier. Cytology was unable to locate previous sample. Jesse Shelton

## 2019-10-07 LAB — URINE CYTOLOGY ANCILLARY ONLY
Chlamydia: POSITIVE — AB
Chlamydia: POSITIVE — AB
Comment: NEGATIVE
Comment: NEGATIVE
Comment: NORMAL
Comment: NEGATIVE
Comment: NORMAL
Neisseria Gonorrhea: NEGATIVE
Neisseria Gonorrhea: NEGATIVE
Trichomonas: NEGATIVE

## 2019-10-10 DIAGNOSIS — A749 Chlamydial infection, unspecified: Secondary | ICD-10-CM | POA: Insufficient documentation

## 2019-10-10 MED ORDER — AZITHROMYCIN 500 MG PO TABS: 1000.0000 mg | ORAL_TABLET | Freq: Every day | ORAL | 0 refills | Status: DC

## 2019-10-10 MED FILL — AZITHROMYCIN 500 MG TABLET: 500 | 1 days supply | Qty: 2 | Fill #0

## 2019-10-10 NOTE — Addendum Note (Signed)
Addended by: Moses Manners on: 10/10/2019 10:42 AM   Modules accepted: Orders

## 2019-10-10 NOTE — Assessment & Plan Note (Signed)
Called patient.  He had seen on MyChart that Chlamydia was positive.  Confirmed pharmacy and will Rx with azithro 1,000 mg single dose.  Partner will need Rx.

## 2019-11-07 ENCOUNTER — Ambulatory Visit: Payer: No Typology Code available for payment source | Admitting: Psychology

## 2019-11-16 ENCOUNTER — Other Ambulatory Visit: Payer: Self-pay

## 2019-11-16 ENCOUNTER — Ambulatory Visit (INDEPENDENT_AMBULATORY_CARE_PROVIDER_SITE_OTHER): Payer: PRIVATE HEALTH INSURANCE | Admitting: Family Medicine

## 2019-11-16 ENCOUNTER — Other Ambulatory Visit (HOSPITAL_COMMUNITY)
Admission: RE | Admit: 2019-11-16 | Discharge: 2019-11-16 | Disposition: A | Discharge status: Home / Self Care | Payer: PRIVATE HEALTH INSURANCE | Source: Ambulatory Visit | Attending: Family Medicine | Admitting: Family Medicine

## 2019-11-16 ENCOUNTER — Encounter: Payer: Self-pay | Admitting: Family Medicine

## 2019-11-16 VITALS — BP 110/82 | HR 78 | Wt 244.6 lb

## 2019-11-16 DIAGNOSIS — E119 Type 2 diabetes mellitus without complications: Secondary | ICD-10-CM | POA: Diagnosis not present

## 2019-11-16 DIAGNOSIS — Z716 Tobacco abuse counseling: Secondary | ICD-10-CM

## 2019-11-16 DIAGNOSIS — Z113 Encounter for screening for infections with a predominantly sexual mode of transmission: Secondary | ICD-10-CM

## 2019-11-16 DIAGNOSIS — F331 Major depressive disorder, recurrent, moderate: Secondary | ICD-10-CM | POA: Diagnosis not present

## 2019-11-16 DIAGNOSIS — A6002 Herpesviral infection of other male genital organs: Secondary | ICD-10-CM

## 2019-11-16 DIAGNOSIS — E1169 Type 2 diabetes mellitus with other specified complication: Secondary | ICD-10-CM

## 2019-11-16 DIAGNOSIS — F411 Generalized anxiety disorder: Secondary | ICD-10-CM

## 2019-11-16 DIAGNOSIS — Z0000 Encounter for general adult medical examination without abnormal findings: Secondary | ICD-10-CM | POA: Diagnosis not present

## 2019-11-16 DIAGNOSIS — N529 Male erectile dysfunction, unspecified: Secondary | ICD-10-CM

## 2019-11-16 DIAGNOSIS — Z720 Tobacco use: Secondary | ICD-10-CM

## 2019-11-16 DIAGNOSIS — E785 Hyperlipidemia, unspecified: Secondary | ICD-10-CM

## 2019-11-16 LAB — POCT GLYCOSYLATED HEMOGLOBIN (HGB A1C): HbA1c, POC (controlled diabetic range): 6.8 % (ref 0.0–7.0)

## 2019-11-16 NOTE — Patient Instructions (Signed)
Thank you for coming in today. I will call you with your lab results. As discussed I recommend cutting back on the Black and Milds.   Please continue to work on diet and exercise as this will do wonders to your diabetes.  Please schedule a follow up appointment to further discuss you anxiety and depression at your earliest convenience.   Take care, Dr. Mauri Reading

## 2019-11-16 NOTE — Progress Notes (Signed)
Subjective:   Patient ID: Jesse Shelton    DOB: 03/05/1981, 39 y.o. male   MRN: [id number]  Jesse Shelton is a 39 y.o. male with a history of OSA, T2DM, GAD/MDD, erectile dysfunction, obesity here for annual physical exam.  Annual Physical Exam:  Patient here today for annual physical exam. He denies any current concerns.  GAD/MDD: History of MDD and anxiety. Currently on Wellbutrin and Remeron. Endorses taking it occasionally. Tolerating well. Denies any concerns or complaints. GAD-7 score: 7, PHQ-9 score 4. Wonders if he needs it anymore or if he needs another medication.   Type 2 Diabetes: Last A1C 6.7, A1C today 6.8. Currently treating with lifestyle modifications. Denies any polyuria, polydipsia, polyphagia. CBG's fasting and with meals range between 120-150. Due for Diabetic eye exam, urine microalbumin. Patient has not been exercising but is motivated.  Erectile Dysfunction: Currently on Sildenafil 100mg  PRN. Denies any concerns. Tolerating well.  History of Genital Herpes: Currently on Valtrex 1g PRN for outbreak. Denies any current outbreak.  Hyperlipidemia: Elevated lipid panel in 10/2018. Not currently on statin. Open to starting if necessary.  Health Maintenance: Due for Diabetic eye exam, urine microalbumin  Social History: Tobacco: Smokes 1 black and mild every 3 days. Alcohol: occasional, 1 beer 1x/week Sexually: yes 1 male sexual partner, no condoms. Illicit drugs: Marijuana, occasional    ROS: Patient reports no vision/ hearing changes,anorexia, weight change, fever ,adenopathy, persistant / recurrent hoarseness, swallowing issues, chest pain, edema,persistant / recurrent cough, hemoptysis, dyspnea(rest, exertional, paroxysmal nocturnal), gastrointestinal  bleeding (melena, rectal bleeding), abdominal pain, excessive heart burn, GU symptoms(dysuria, hematuria, pyuria, voiding/incontinence  Issues) syncope, focal weakness, severe memory loss, concerning  skin lesions, depression, anxiety, abnormal bruising/bleeding, major joint swelling.    Finger, medications and smoking status reviewed.  Objective:   BP 110/82   Pulse 78   Wt 244 lb 9.6 oz (110.9 kg)   SpO2 99%   BMI 37.19 kg/m  Vitals and nursing note reviewed.  General: pleasant AA male, well nourished, well developed, in no acute distress with non-toxic appearance CV: regular rate and rhythm without murmurs, rubs, or gallops, no lower extremity edema, 2+ radial pulses bilaterally Lungs: clear to auscultation bilaterally with normal work of breathing Abdomen: soft, non-tender, non-distended Skin: warm, dry Extremities: warm and well perfused MSK: gait normal Neuro: Alert and oriented, speech normal  Assessment & Plan:   Annual Physical Exam: Jesse Shelton is a 39 y.o. male presenting today for annual physical exam. Patient is without any acute concerns today and negative review of symptoms. See plan per problem below.   Morbid obesity ([place]) A1C consistent with diabetes. Lipid panel with elevated triglycerides and LDL, low HLD.  - counseled on importance of lifestyle modifications such as diet and exercise  - discussed nutrition referral. Opted to defer at this time.  ED (erectile dysfunction) of organic origin Chronic. Has been on Viagra is at least 2014 per chart review. Etiology likely multifactorial including metabolic syndrome, obesity, OSA and tobacco use. Well treated with PRN Viagra.  - continue Sildenafil 100mg  QD PRN  Depression, major, recurrent, moderate (HCC) Chronic. Intermittent use of Wellbutrin and Remeron. Discussed recommended timing of medications (best to take Wellbutrin first thing in the morning and Remeron at night for sleep). He noted he will try to make these adjustments. If desires, patient may schedule follow up visit to further discuss medications.  Type 2 diabetes mellitus without complications (HCC) P7T slowly up trending, 6.7>6.8. Has tried  Metformin in the  past but self discontinued as he felt "it was making his A1C worse". Patient is motivated to work on life style modifications at this time. Feel this is reasonable however may need to consider restarting Metformin if A1C continues to worsen.  - RTC in ~3-6 months for A1C check - patient to call and schedule diabetic eye exam at earliest convenience. Patient instructed to have results faxed to [facility] for records  Genital herpes Denies any current outbreak. Continue Valtrex 1g PRN for outbreaks.  Hyperlipidemia associated with type 2 diabetes mellitus (HCC) Most recent lipid panel notable for Chol 188, HDL 43, Trig 107, LDL 126. Given diagnosis of diabetes, patient is at high risk for cardiovascular disease. Recommend mod-high intensity statin therapy. Patient was amendable to this.  - start Atorvastatin 40mg  QD.  Encounter for tobacco use cessation counseling Patient was counseled on the risks of tobacco use and cessation strongly encouraged.   Screen for STDs Per patient request, STD screen ordered including GC/Cl/Tric, RPR, HIV. STD screen negative.   Orders Placed This Encounter  Procedures  . RPR  . HIV antibody (with reflex)  . Lipid Panel  . Comprehensive metabolic panel  . Microalbumin/Creatinine Ratio, Urine  . HgB A1c    , DO PGY-2, [facility] 11/18/2019 3:47 PM

## 2019-11-17 LAB — URINE CYTOLOGY ANCILLARY ONLY
Chlamydia: NEGATIVE
Comment: NEGATIVE
Comment: NEGATIVE
Comment: NORMAL
Neisseria Gonorrhea: NEGATIVE
Trichomonas: NEGATIVE

## 2019-11-17 LAB — COMPREHENSIVE METABOLIC PANEL
ALT: 27 IU/L (ref 0–44)
AST: 14 IU/L (ref 0–40)
Albumin/Globulin Ratio: 1.5 (ref 1.2–2.2)
Albumin: 4.4 g/dL (ref 4.0–5.0)
Alkaline Phosphatase: 101 IU/L (ref 39–117)
BUN/Creatinine Ratio: 8 — ABNORMAL LOW (ref 9–20)
BUN: 9 mg/dL (ref 6–20)
Bilirubin Total: 0.3 mg/dL (ref 0.0–1.2)
CO2: 21 mmol/L (ref 20–29)
Calcium: 9.5 mg/dL (ref 8.7–10.2)
Chloride: 107 mmol/L — ABNORMAL HIGH (ref 96–106)
Creatinine, Ser: 1.15 mg/dL (ref 0.76–1.27)
GFR calc Af Amer: 94 mL/min/{1.73_m2} (ref >59)
GFR calc non Af Amer: 81 mL/min/{1.73_m2} (ref >59)
Globulin, Total: 3 g/dL (ref 1.5–4.5)
Glucose: 98 mg/dL (ref 65–99)
Potassium: 4.2 mmol/L (ref 3.5–5.2)
Sodium: 141 mmol/L (ref 134–144)
Total Protein: 7.4 g/dL (ref 6.0–8.5)

## 2019-11-17 LAB — HIV ANTIBODY (ROUTINE TESTING W REFLEX): HIV Screen 4th Generation wRfx: NONREACTIVE

## 2019-11-17 LAB — LIPID PANEL
Chol/HDL Ratio: 4.4 ratio (ref 0.0–5.0)
Cholesterol, Total: 188 mg/dL (ref 100–199)
HDL: 43 mg/dL (ref >39)
LDL Chol Calc (NIH): 126 mg/dL — ABNORMAL HIGH (ref 0–99)
Triglycerides: 107 mg/dL (ref 0–149)
VLDL Cholesterol Cal: 19 mg/dL (ref 5–40)

## 2019-11-17 LAB — RPR: RPR Ser Ql: NONREACTIVE

## 2019-11-18 DIAGNOSIS — Z716 Tobacco abuse counseling: Secondary | ICD-10-CM | POA: Insufficient documentation

## 2019-11-18 DIAGNOSIS — Z720 Tobacco use: Secondary | ICD-10-CM | POA: Insufficient documentation

## 2019-11-18 DIAGNOSIS — E1169 Type 2 diabetes mellitus with other specified complication: Secondary | ICD-10-CM | POA: Insufficient documentation

## 2019-11-18 DIAGNOSIS — E785 Hyperlipidemia, unspecified: Secondary | ICD-10-CM | POA: Insufficient documentation

## 2019-11-18 MED ORDER — ATORVASTATIN CALCIUM 40 MG PO TABS: 40.0000 mg | ORAL_TABLET | Freq: Every day | ORAL | 3 refills | Status: DC

## 2019-11-18 NOTE — Assessment & Plan Note (Signed)
Chronic. Intermittent use of Wellbutrin and Remeron. Discussed recommended timing of medications (best to take Wellbutrin first thing in the morning and Remeron at night for sleep). He noted he will try to make these adjustments. If desires, patient may schedule follow up visit to further discuss medications.

## 2019-11-18 NOTE — Assessment & Plan Note (Deleted)
Consistent with early onset diabetes, elevated triglyceride level, low HDL, and larger waist obesity.  - counseling on lifestyle modifications and weight loss - Discussed referral to nutritionist. Opted to defer at this time.

## 2019-11-18 NOTE — Assessment & Plan Note (Signed)
Most recent lipid panel notable for Chol 188, HDL 43, Trig 107, LDL 126. Given diagnosis of diabetes, patient is at high risk for cardiovascular disease. Recommend mod-high intensity statin therapy. Patient was amendable to this.  - start Atorvastatin 40mg  QD.

## 2019-11-18 NOTE — Assessment & Plan Note (Signed)
Denies any current outbreak. Continue Valtrex 1g PRN for outbreaks.

## 2019-11-18 NOTE — Assessment & Plan Note (Signed)
A1C slowly up trending, 6.7>6.8. Has tried Metformin in the past but self discontinued as he felt "it was making his A1C worse". Patient is motivated to work on life style modifications at this time. Feel this is reasonable however may need to consider restarting Metformin if A1C continues to worsen.  - RTC in ~3-6 months for A1C check - patient to call and schedule diabetic eye exam at earliest convenience. Patient instructed to have results faxed to [facility] for records

## 2019-11-18 NOTE — Assessment & Plan Note (Signed)
A1C consistent with diabetes. Lipid panel with elevated triglycerides and LDL, low HLD.  - counseled on importance of lifestyle modifications such as diet and exercise  - discussed nutrition referral. Opted to defer at this time.

## 2019-11-18 NOTE — Assessment & Plan Note (Signed)
Chronic. Has been on Viagra is at least 2014 per chart review. Etiology likely multifactorial including metabolic syndrome, obesity, OSA and tobacco use. Well treated with PRN Viagra.  - continue Sildenafil 100mg  QD PRN

## 2019-11-18 NOTE — Assessment & Plan Note (Signed)
Patient was counseled on the risks of tobacco use and cessation strongly encouraged. 

## 2019-11-24 ENCOUNTER — Telehealth: Payer: Self-pay

## 2019-11-24 NOTE — Telephone Encounter (Signed)
Called to inform patient of results but he had already viewed them on [facility].  Glennie Hawk, CMA

## 2019-11-28 ENCOUNTER — Encounter: Payer: Self-pay | Admitting: Family Medicine

## 2020-05-01 ENCOUNTER — Other Ambulatory Visit: Payer: Self-pay

## 2020-05-01 ENCOUNTER — Encounter: Payer: Self-pay | Admitting: Family Medicine

## 2020-05-01 ENCOUNTER — Ambulatory Visit (INDEPENDENT_AMBULATORY_CARE_PROVIDER_SITE_OTHER): Payer: PRIVATE HEALTH INSURANCE | Admitting: Family Medicine

## 2020-05-01 VITALS — BP 108/82 | HR 100 | Ht 68.0 in | Wt 247.0 lb

## 2020-05-01 DIAGNOSIS — E119 Type 2 diabetes mellitus without complications: Secondary | ICD-10-CM | POA: Diagnosis not present

## 2020-05-01 DIAGNOSIS — Z113 Encounter for screening for infections with a predominantly sexual mode of transmission: Secondary | ICD-10-CM | POA: Diagnosis not present

## 2020-05-01 LAB — POCT GLYCOSYLATED HEMOGLOBIN (HGB A1C): HbA1c, POC (controlled diabetic range): 7.1 % — AB (ref 0.0–7.0)

## 2020-05-01 NOTE — Assessment & Plan Note (Addendum)
Obtaining STD screening as requested by patient. Discussed PrEP with patient if he were to ever feel that he was in a high risk situation with sexual partners.

## 2020-05-01 NOTE — Assessment & Plan Note (Signed)
Obtaining A1C today. Patient encouraged to make follow up with his PCP for DM check in

## 2020-05-01 NOTE — Progress Notes (Signed)
    SUBJECTIVE:   CHIEF COMPLAINT / HPI:   STI Testing  Patient desiring STI Testing. Sexually active with females No current symptoms or complaints.   Also asking for A1C to be collected.   PERTINENT  PMH / PSH: DM II   OBJECTIVE:   BP 108/82   Pulse 100   Ht 5\' 8"  (1.727 m)   Wt 247 lb (112 kg)   SpO2 98%   BMI 37.56 kg/m   General: well appearing male, obese, nad.  ASSESSMENT/PLAN:   Type 2 diabetes mellitus without complications (HCC) Obtaining A1C today. Patient encouraged to make follow up with his PCP for DM check in   Screening examination for STD (sexually transmitted disease) Obtaining STD screening as requested by patient. Discussed PrEP with patient if he were to ever feel that he was in a high risk situation with sexual partners.   , MD [facility]

## 2020-05-02 ENCOUNTER — Other Ambulatory Visit (HOSPITAL_COMMUNITY)
Admission: RE | Admit: 2020-05-02 | Discharge: 2020-05-02 | Disposition: A | Discharge status: Home / Self Care | Payer: PRIVATE HEALTH INSURANCE | Source: Ambulatory Visit | Attending: Family Medicine | Admitting: Family Medicine

## 2020-05-02 DIAGNOSIS — Z113 Encounter for screening for infections with a predominantly sexual mode of transmission: Secondary | ICD-10-CM | POA: Insufficient documentation

## 2020-05-02 LAB — RPR: RPR Ser Ql: NONREACTIVE

## 2020-05-02 LAB — HIV ANTIBODY (ROUTINE TESTING W REFLEX): HIV Screen 4th Generation wRfx: NONREACTIVE

## 2020-05-02 NOTE — Addendum Note (Signed)
Addended by: Georges Lynch T on: 05/02/2020 08:38 AM   Modules accepted: Orders

## 2020-05-03 ENCOUNTER — Encounter: Payer: Self-pay | Admitting: Family Medicine

## 2020-05-03 LAB — CERVICOVAGINAL ANCILLARY ONLY
Chlamydia: NEGATIVE
Comment: NEGATIVE
Comment: NORMAL
Neisseria Gonorrhea: NEGATIVE

## 2020-07-18 IMAGING — CT CT ABD-PELV W/ CM
2 of 4 series · 16 of 46 positions shown, 18 images · IV contrast (ISOVUE)
Comparison: None.

CLINICAL DATA: Lower abdominal pain with diarrhea and vomiting

EXAM:
CT ABDOMEN AND PELVIS WITH CONTRAST
TECHNIQUE: Multidetector CT imaging of the abdomen and pelvis was performed
using the standard protocol following bolus administration of
intravenous contrast.
CONTRAST:  100mL 2P1CK2-V44 IOPAMIDOL (2P1CK2-V44) INJECTION 61%

[Series 2: axial st · axial · 0.77mm/px · z∈[-527,-72]mm · 13 of 105 slices shown, 15 images]
[im 7/105  soft-tissue]
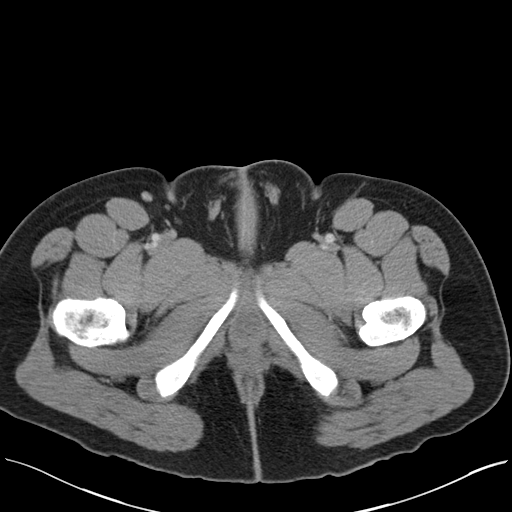
[im 7/105  bone]
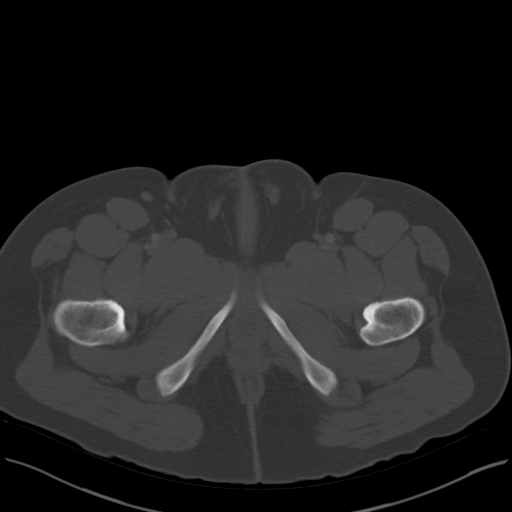
[im 13/105  soft-tissue]
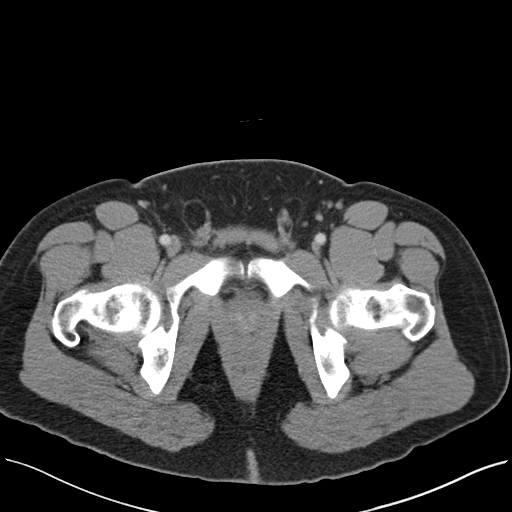
[im 25/105  soft-tissue]
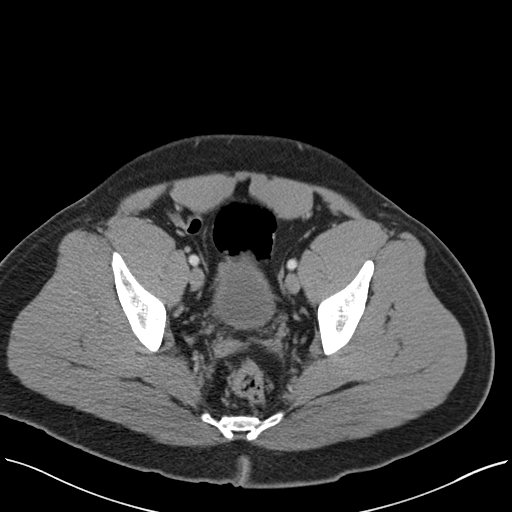
[im 31/105  soft-tissue]
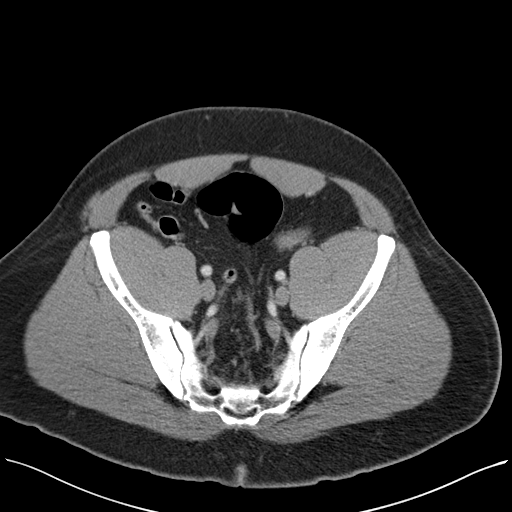
[im 37/105  soft-tissue]
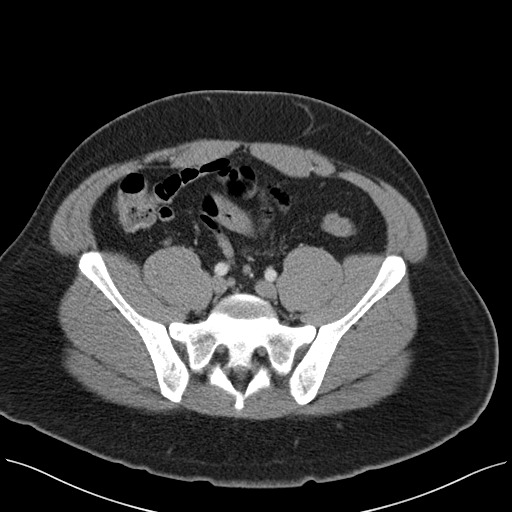
[im 43/105  soft-tissue]
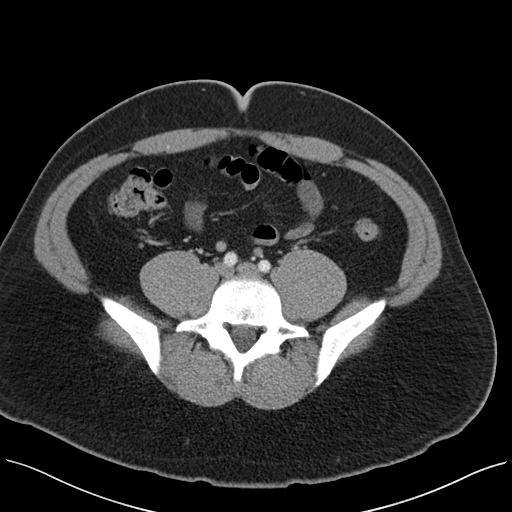
[im 56/105  soft-tissue]
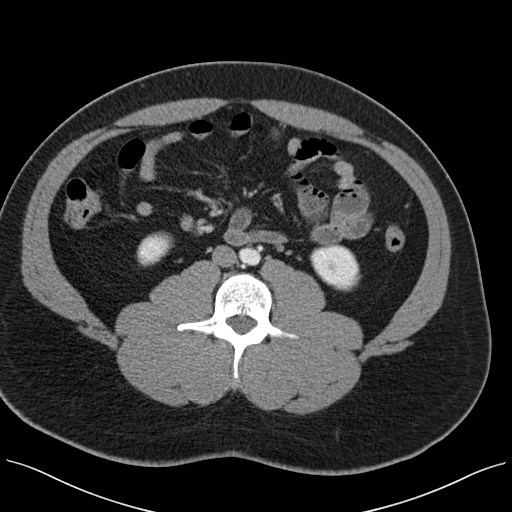
[im 62/105  soft-tissue]
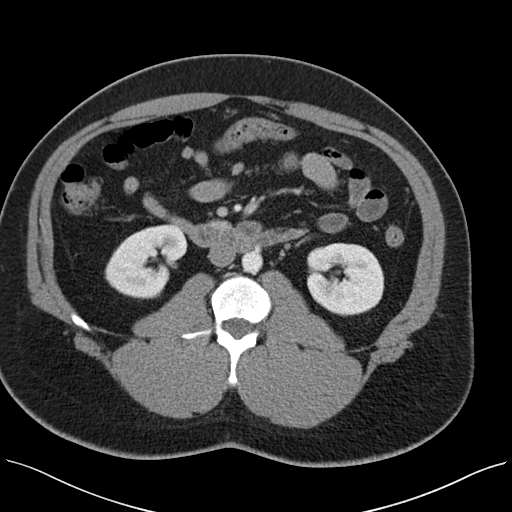
[im 68/105  soft-tissue]
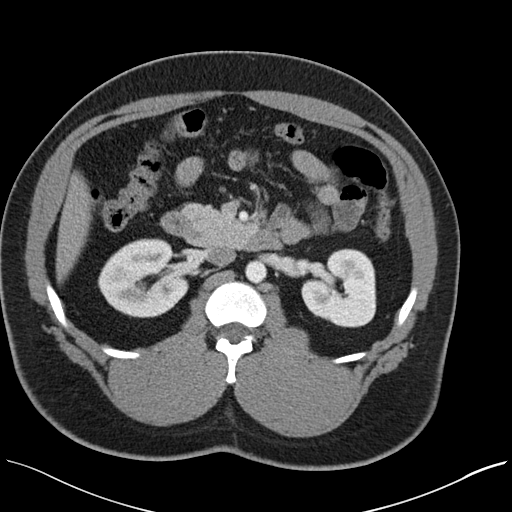
[im 68/105  bone]
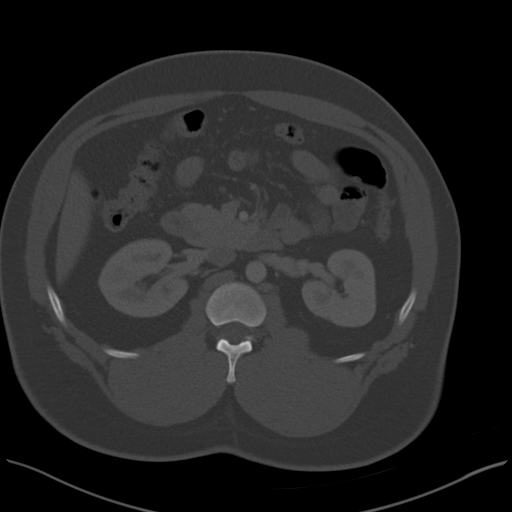
[im 74/105  soft-tissue]
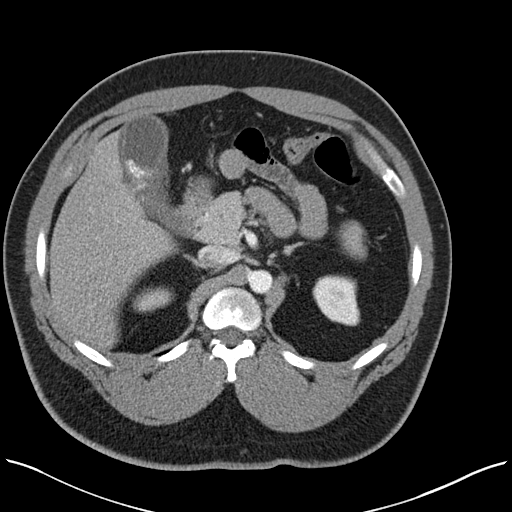
[im 80/105  soft-tissue]
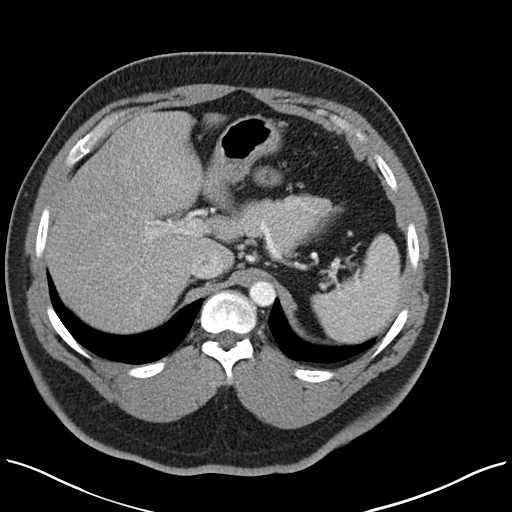
[im 92/105  soft-tissue]
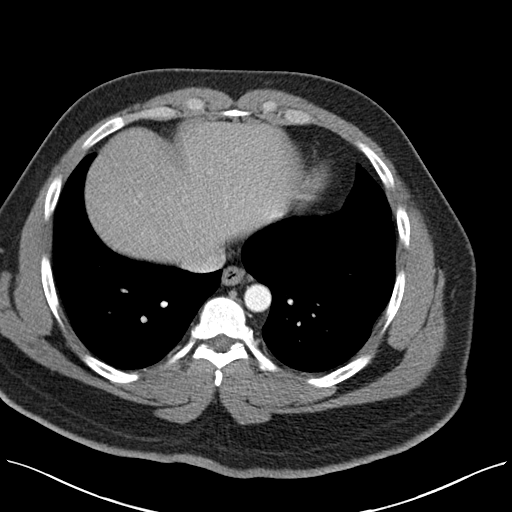
[im 98/105  soft-tissue]
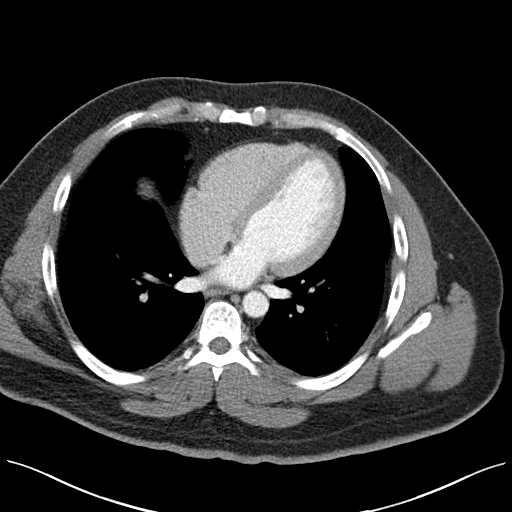

[Series 5: coronal st · coronal · 0.83mm/px · 3 of 110 slices shown]
[im 37/110  soft-tissue]
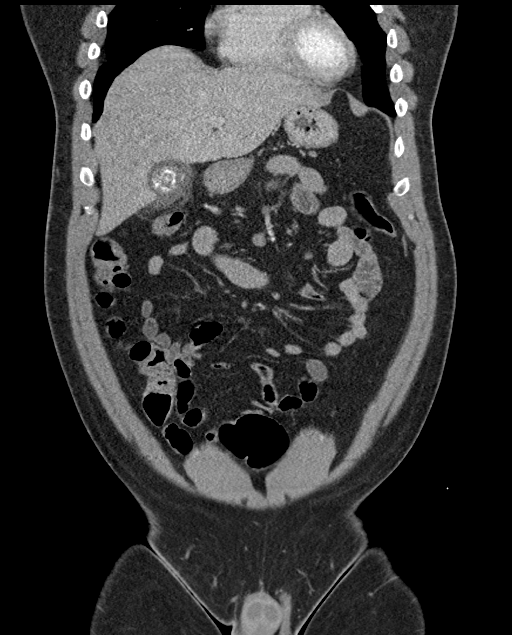
[im 49/110  soft-tissue]
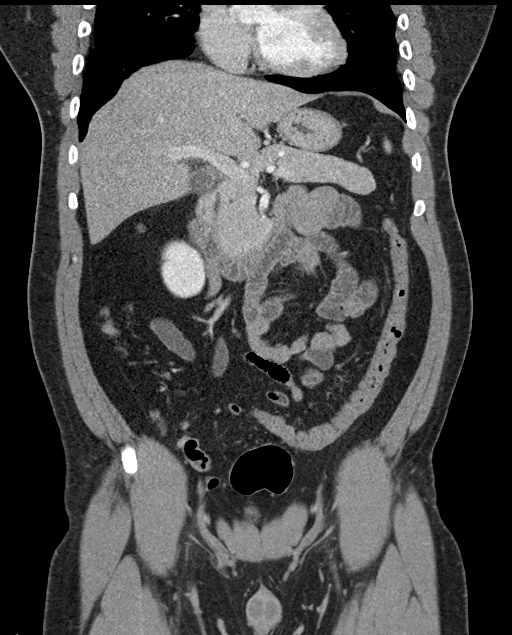
[im 61/110  soft-tissue]
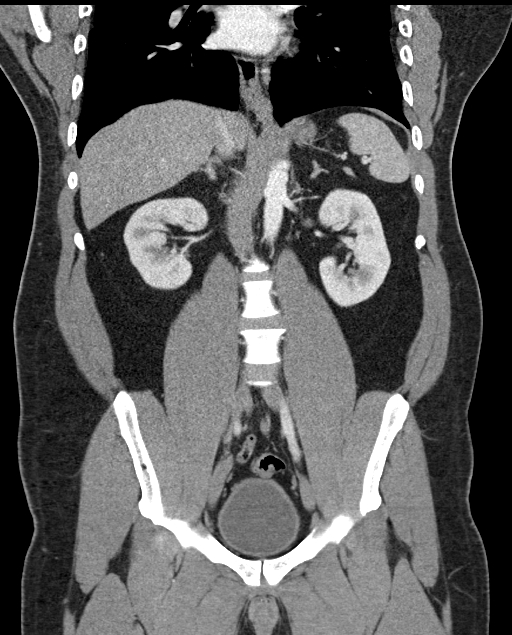

[16 of 46 positions shown; findings below may reference images not displayed]

FINDINGS: Lower chest: Lung bases are clear.

Hepatobiliary: No focal liver lesions are evident. There are
multiple gallstones and sludge within the gallbladder. There are
areas of apparent gallbladder wall thickening with a questionable
area of pericholecystic fluid. There is no biliary duct dilatation.

Pancreas: No pancreatic mass or inflammatory focus no.

Spleen: No splenic lesions are evident.

Adrenals/Urinary Tract: Adrenals bilaterally appear unremarkable.
Kidneys bilaterally show no evident mass or hydronephrosis on either
side. There is no renal or ureteral calculus on either side. Urinary
bladder is midline with wall thickness within normal limits.

Stomach/Bowel: There is no appreciable bowel wall or mesenteric
thickening. There is no evident bowel obstruction. There is no free
air or portal venous air.

Vascular/Lymphatic: There is no abdominal aortic aneurysm. No
vascular lesions are evident. No adenopathy is evident in the
abdomen or pelvis.

Reproductive: Prostate and seminal vesicles are normal in size and
contour. No pelvic mass evident.

Other: The appendix appears unremarkable. No periappendiceal region
inflammation. There is no abscess or ascites in the abdomen or
pelvis. There is mild fat in the right inguinal ring.

Musculoskeletal: There are no blastic or lytic bone lesions. There
is no intramuscular or abdominal wall lesion evident.
IMPRESSION: 1. Cholelithiasis with apparent degree of gallbladder wall
thickening and questionable pericholecystic fluid. Appearance of the
gallbladder raises concern for acute cholecystitis. Correlation with
gallbladder ultrasound may be advisable in this regard.

2. Appendix region appears normal. No bowel obstruction. No abscess
in the abdomen or pelvis.

3.  No evident renal or ureteral calculus.  No hydronephrosis.

## 2020-11-14 NOTE — Progress Notes (Signed)
SUBJECTIVE:   Chief compliant/HPI: annual examination  Jesse Shelton is a 40 y.o. who presents today for an annual exam.   PMH: OSA on CPAP, HLD, T2DM, genital herpes, ED, tobacco use, morbid obesity, GAD  Diabetes: Last three A1C's below. Currently diet controlled. Denies any polyuria, polydipsia, polyphagia. Due for diabetic eye exam, foot exam, and urine microalbumin.  Lab Results  Component Value Date   HGBA1C 7.1 (A) 11/15/2020   HGBA1C 7.1 (A) 05/01/2020   HGBA1C 6.8 11/16/2019   HLD: Last lipid panel below. Supposed to be on Lipitor 40mg  QD. Denies taking the medicine.   Lab Results  Component Value Date   CHOL 178 11/15/2020   HDL 42 11/15/2020   LDLCALC 119 (H) 11/15/2020   LDLDIRECT 115 (H) 11/10/2017   TRIG 90 11/15/2020   CHOLHDL 4.2 11/15/2020   Tobacco use:  Smoke 2 cigarettes a week. Has smoked for 15 years. Doesnot qualify for lung cancer screen at this time.  Declines nicotine replacement.  Social History: Alcohol: occasional Illicit Drugs: [place] 8 occasional  Safe at home: yes Depression/Suicidality:   Health Maintenance: Health Maintenance Due  Topic  . OPHTHALMOLOGY EXAM   . INFLUENZA VACCINE    Review of systems: See HPI  OBJECTIVE:   BP 112/70   Pulse 73   Ht 5\' 9"  (1.753 m)   Wt 247 lb 3.2 oz (112.1 kg)   SpO2 97%   BMI 36.51 kg/m   General: pleasant middle aged man, sitting comfortably in exam chair, well nourished, well developed, in no acute distress with non-toxic appearance HEENT: normocephalic, atraumatic, moist mucous membranes, oropharynx clear without erythema or exudate, TM normal bilaterally  CV: regular rate and rhythm without murmurs, rubs, or gallops, no lower extremity edema, 2+ radial and pedal pulses bilaterally Lungs: clear to auscultation bilaterally with normal work of breathing on room air, speaking in full sentences Abdomen: soft, non-tender, non-distended,  normoactive bowel sounds Skin: warm, dry, no  rashes or lesions MSK:  gait normal Neuro: Alert and oriented, speech normal  Diabetic foot exam was performed with the following findings:   No deformities, ulcerations, or other skin breakdown Normal sensation of 10g monofilament Intact posterior tibialis and dorsalis pedis pulses    Depression screen [facility] 2/9 11/15/2020 05/01/2020 11/16/2019  Decreased Interest 1 1 1   Down, Depressed, Hopeless 1 1 1   PHQ - 2 Score 2 2 2   Altered sleeping 0 0 0  Tired, decreased energy 0 1 1  Change in appetite 0 2 0  Feeling bad or failure about yourself  1 2 1   Trouble concentrating 1 0 0  Moving slowly or fidgety/restless 0 0 0  Suicidal thoughts 0 0 0  PHQ-9 Score 4 7 4   Difficult doing work/chores Not difficult at all Somewhat difficult Somewhat difficult     ASSESSMENT/PLAN:   Annual Physical Exam: Patient here today for annual physical exam.  PMH, surgical history, and social history were reviewed. The following concerns below were discussed.   OSA (obstructive sleep apnea) Chronic, compliant with CPAP.  - continue CPAP  Hyperlipidemia associated with type 2 diabetes mellitus (HCC) Chronic, noncompliant with statin. Current smoker. Diabetic.  - strongly encouraged to restart Lipitor 40mg  QD  Type 2 diabetes mellitus without complications (HCC) Chronic. Stable at 7.1 - encourage weight loss and healthy eating, portion control, and limiting carbohydrates - would benefit from Metformin. Will discuss with patient.  - discussed smoking cessation at length and cessation strongly encouraged -  urine microalbumin with normal kidney function - diabetic foot exam normal - encouraged to schedule diabetic eye exam at earliest convenience  Genital herpes Chronic. Last flare a couple of years ago. Currently on PRN Valtrex. Denies any current symptoms.  ED (erectile dysfunction) of organic origin Chronic. Well controlled with Viagra PRN.   GAD (generalized anxiety disorder) Chronic. Not  currently taking any medications. Interested in restarting Wellbutrin 300mg  XR and Mirtazapine 30mg  qHS. Denies any SI/HI. Denies any new stressors. Denies depressed mood, insomnia, anorexia, loss of pleasure, suicidal ideation, poor concentration or irritability. PHQ-9 score 4. Plans to establish with therapy.  - restart Wellbutrin and Mirtazapine as prescribed  Tobacco use Smoke 2 cigarettes a week. Has smoked for 15 years. Does not qualify for lung cancer screen at this time.  Declines nicotine replacement. - Patient was counseled on the risks of tobacco use and cessation strongly encouraged.  Mild episode of recurrent major depressive disorder (HCC) Chronic. Very mild symptoms but interested in restarting medications. Denies SI/HI - plan as above  High risk heterosexual behavior Per patient request, HIV, Hep C, Hep B, RPR, and GC/Cl obtained and negative.  Vitamin D deficiency Vit D severely low at 5.  - treat with Vitamin D 50,000 IU q weekly x 3 months - plan to recheck at that time   Annual Examination  See AVS for age appropriate recommendations  PHQ score 4, reviewed and discussed.  Blood pressure reviewed and at goal.   Considered the following items based upon USPSTF recommendations: HIV testing: ordered Hepatitis C: ordered Hepatitis B: ordered Syphilis if at high risk: {ordered GC/CTrequested and ordered Reviewed risk factors for latent tuberculosis and not indicated Immunizations: due for flu  Follow up in 1 year or sooner if indicated.    , DO [facility]

## 2020-11-15 ENCOUNTER — Other Ambulatory Visit: Payer: Self-pay

## 2020-11-15 ENCOUNTER — Other Ambulatory Visit (HOSPITAL_COMMUNITY)
Admission: RE | Admit: 2020-11-15 | Discharge: 2020-11-15 | Disposition: A | Discharge status: Home / Self Care | Payer: PRIVATE HEALTH INSURANCE | Source: Ambulatory Visit | Attending: Family Medicine | Admitting: Family Medicine

## 2020-11-15 ENCOUNTER — Encounter: Payer: Self-pay | Admitting: Family Medicine

## 2020-11-15 ENCOUNTER — Ambulatory Visit (INDEPENDENT_AMBULATORY_CARE_PROVIDER_SITE_OTHER): Payer: PRIVATE HEALTH INSURANCE | Admitting: Family Medicine

## 2020-11-15 VITALS — BP 112/70 | HR 73 | Ht 69.0 in | Wt 247.2 lb

## 2020-11-15 DIAGNOSIS — Z7251 High risk heterosexual behavior: Secondary | ICD-10-CM | POA: Diagnosis not present

## 2020-11-15 DIAGNOSIS — Z113 Encounter for screening for infections with a predominantly sexual mode of transmission: Secondary | ICD-10-CM

## 2020-11-15 DIAGNOSIS — E785 Hyperlipidemia, unspecified: Secondary | ICD-10-CM

## 2020-11-15 DIAGNOSIS — E119 Type 2 diabetes mellitus without complications: Secondary | ICD-10-CM | POA: Diagnosis not present

## 2020-11-15 DIAGNOSIS — Z0000 Encounter for general adult medical examination without abnormal findings: Secondary | ICD-10-CM

## 2020-11-15 DIAGNOSIS — G4733 Obstructive sleep apnea (adult) (pediatric): Secondary | ICD-10-CM

## 2020-11-15 DIAGNOSIS — E1169 Type 2 diabetes mellitus with other specified complication: Secondary | ICD-10-CM

## 2020-11-15 DIAGNOSIS — F329 Major depressive disorder, single episode, unspecified: Secondary | ICD-10-CM

## 2020-11-15 DIAGNOSIS — F330 Major depressive disorder, recurrent, mild: Secondary | ICD-10-CM | POA: Diagnosis not present

## 2020-11-15 DIAGNOSIS — A6002 Herpesviral infection of other male genital organs: Secondary | ICD-10-CM

## 2020-11-15 DIAGNOSIS — E559 Vitamin D deficiency, unspecified: Secondary | ICD-10-CM

## 2020-11-15 DIAGNOSIS — F331 Major depressive disorder, recurrent, moderate: Secondary | ICD-10-CM

## 2020-11-15 DIAGNOSIS — F411 Generalized anxiety disorder: Secondary | ICD-10-CM

## 2020-11-15 DIAGNOSIS — Z720 Tobacco use: Secondary | ICD-10-CM

## 2020-11-15 DIAGNOSIS — N529 Male erectile dysfunction, unspecified: Secondary | ICD-10-CM

## 2020-11-15 LAB — POCT GLYCOSYLATED HEMOGLOBIN (HGB A1C): Hemoglobin A1C: 7.1 % — AB (ref 4.0–5.6)

## 2020-11-15 MED ORDER — BUPROPION HCL ER (XL) 300 MG PO TB24: 300.0000 mg | ORAL_TABLET | Freq: Every day | ORAL | 3 refills | Status: DC

## 2020-11-15 MED ORDER — MIRTAZAPINE 30 MG PO TABS: 30.0000 mg | ORAL_TABLET | Freq: Every day | ORAL | 0 refills | Status: DC

## 2020-11-15 NOTE — Patient Instructions (Signed)
It was a pleasure to see you today!  Thank you for choosing [facility] for your primary care.   Our plans for today were:  I have obtained blood work. I will call you if anything is abnormal.  You should return to our clinic in 6 months   Best Wishes,   Orpah Cobb, DO

## 2020-11-16 LAB — BASIC METABOLIC PANEL
BUN/Creatinine Ratio: 15 (ref 9–20)
BUN: 15 mg/dL (ref 6–20)
CO2: 20 mmol/L (ref 20–29)
Calcium: 9.6 mg/dL (ref 8.7–10.2)
Chloride: 102 mmol/L (ref 96–106)
Creatinine, Ser: 0.97 mg/dL (ref 0.76–1.27)
GFR calc Af Amer: 115 mL/min/{1.73_m2} (ref >59)
GFR calc non Af Amer: 99 mL/min/{1.73_m2} (ref >59)
Glucose: 126 mg/dL — ABNORMAL HIGH (ref 65–99)
Potassium: 4.3 mmol/L (ref 3.5–5.2)
Sodium: 137 mmol/L (ref 134–144)

## 2020-11-16 LAB — LIPID PANEL
Chol/HDL Ratio: 4.2 ratio (ref 0.0–5.0)
Cholesterol, Total: 178 mg/dL (ref 100–199)
HDL: 42 mg/dL (ref >39)
LDL Chol Calc (NIH): 119 mg/dL — ABNORMAL HIGH (ref 0–99)
Triglycerides: 90 mg/dL (ref 0–149)
VLDL Cholesterol Cal: 17 mg/dL (ref 5–40)

## 2020-11-16 LAB — HIV ANTIBODY (ROUTINE TESTING W REFLEX): HIV Screen 4th Generation wRfx: NONREACTIVE

## 2020-11-16 LAB — URINE CYTOLOGY ANCILLARY ONLY
Chlamydia: NEGATIVE
Comment: NEGATIVE
Comment: NEGATIVE
Comment: NORMAL
Neisseria Gonorrhea: NEGATIVE
Trichomonas: NEGATIVE

## 2020-11-16 LAB — MICROALBUMIN / CREATININE URINE RATIO
Creatinine, Urine: 151.5 mg/dL
Microalb/Creat Ratio: <2 mg/g creat (ref 0–29)
Microalbumin, Urine: <3 ug/mL

## 2020-11-16 LAB — HEPATITIS C ANTIBODY: Hep C Virus Ab: <0.1 s/co ratio (ref 0.0–0.9)

## 2020-11-16 LAB — RPR: RPR Ser Ql: NONREACTIVE

## 2020-11-16 LAB — HEPATITIS B SURFACE ANTIGEN: Hepatitis B Surface Ag: NEGATIVE

## 2020-11-16 LAB — VITAMIN D 25 HYDROXY (VIT D DEFICIENCY, FRACTURES): Vit D, 25-Hydroxy: 5.1 ng/mL — ABNORMAL LOW (ref 30.0–100.0)

## 2020-11-17 DIAGNOSIS — E559 Vitamin D deficiency, unspecified: Secondary | ICD-10-CM | POA: Insufficient documentation

## 2020-11-17 DIAGNOSIS — F330 Major depressive disorder, recurrent, mild: Secondary | ICD-10-CM | POA: Insufficient documentation

## 2020-11-17 MED ORDER — VITAMIN D (ERGOCALCIFEROL) 1.25 MG (50000 UNIT) PO CAPS
50000.0000 [IU] | ORAL_CAPSULE | ORAL | 0 refills | Status: DC
Start: 2020-11-17 — End: 2020-11-19

## 2020-11-17 NOTE — Assessment & Plan Note (Signed)
Chronic. Last flare a couple of years ago. Currently on PRN Valtrex. Denies any current symptoms.

## 2020-11-17 NOTE — Assessment & Plan Note (Addendum)
Chronic. Not currently taking any medications. Interested in restarting Wellbutrin 300mg  XR and Mirtazapine 30mg  qHS. Denies any SI/HI. Denies any new stressors. Denies depressed mood, insomnia, anorexia, loss of pleasure, suicidal ideation, poor concentration or irritability. PHQ-9 score 4. Plans to establish with therapy.  - restart Wellbutrin and Mirtazapine as prescribed

## 2020-11-17 NOTE — Assessment & Plan Note (Signed)
Smoke 2 cigarettes a week. Has smoked for 15 years. Does not qualify for lung cancer screen at this time.  Declines nicotine replacement. - Patient was counseled on the risks of tobacco use and cessation strongly encouraged.

## 2020-11-17 NOTE — Assessment & Plan Note (Signed)
Per patient request, HIV, Hep C, Hep B, RPR, and GC/Cl obtained and negative.

## 2020-11-17 NOTE — Assessment & Plan Note (Signed)
Vit D severely low at 5.  - treat with Vitamin D 50,000 IU q weekly x 3 months - plan to recheck at that time

## 2020-11-17 NOTE — Assessment & Plan Note (Signed)
Chronic. Very mild symptoms but interested in restarting medications. Denies SI/HI - plan as above

## 2020-11-17 NOTE — Assessment & Plan Note (Signed)
Chronic, compliant with CPAP.  - continue CPAP

## 2020-11-17 NOTE — Assessment & Plan Note (Signed)
Chronic. Well controlled with Viagra PRN.

## 2020-11-17 NOTE — Assessment & Plan Note (Addendum)
Chronic. Stable at 7.1 - encourage weight loss and healthy eating, portion control, and limiting carbohydrates - would benefit from Metformin. Will discuss with patient.  - discussed smoking cessation at length and cessation strongly encouraged - urine microalbumin with normal kidney function - diabetic foot exam normal - encouraged to schedule diabetic eye exam at earliest convenience

## 2020-11-17 NOTE — Assessment & Plan Note (Signed)
Chronic, noncompliant with statin. Current smoker. Diabetic.  - strongly encouraged to restart Lipitor 40mg  QD

## 2020-11-19 ENCOUNTER — Other Ambulatory Visit: Payer: Self-pay | Admitting: Family Medicine

## 2020-11-19 DIAGNOSIS — E559 Vitamin D deficiency, unspecified: Secondary | ICD-10-CM

## 2020-11-19 DIAGNOSIS — N529 Male erectile dysfunction, unspecified: Secondary | ICD-10-CM

## 2020-11-19 MED ORDER — VITAMIN D (ERGOCALCIFEROL) 1.25 MG (50000 UNIT) PO CAPS: 50000.0000 [IU] | ORAL_CAPSULE | ORAL | 0 refills | Status: AC

## 2020-11-19 MED ORDER — SILDENAFIL CITRATE 100 MG PO TABS: ORAL_TABLET | ORAL | 2 refills | Status: DC

## 2021-02-18 ENCOUNTER — Other Ambulatory Visit: Payer: PRIVATE HEALTH INSURANCE

## 2021-02-18 ENCOUNTER — Other Ambulatory Visit: Payer: Self-pay

## 2021-02-18 DIAGNOSIS — E559 Vitamin D deficiency, unspecified: Secondary | ICD-10-CM

## 2021-02-19 LAB — VITAMIN D 25 HYDROXY (VIT D DEFICIENCY, FRACTURES): Vit D, 25-Hydroxy: 33.7 ng/mL (ref 30.0–100.0)

## 2021-03-22 ENCOUNTER — Other Ambulatory Visit: Payer: Self-pay | Admitting: Family Medicine

## 2021-03-22 DIAGNOSIS — F330 Major depressive disorder, recurrent, mild: Secondary | ICD-10-CM

## 2021-05-21 NOTE — Progress Notes (Signed)
    SUBJECTIVE:   CHIEF COMPLAINT / HPI:  STI Screening: Last screened in February and neg for GC/Chlamydia/Syphillis, HIV, Hep C. He likes to be checked twice yearly and is requesting STI screening today.  He denies any new partners or recent high risk sexual encounters.  Vit D: Seen in February, found to have low Vit D level of 5. At that time was prescribed 50,000 IU weekly. On recheck in May had a normal level. Since that time has continued taking the 50,000 units weekly.  Diabetes: Last A1c in February 7.1.  A1c today 9.5.  Not currently on any meds.  States that he has made some dietary interventions, including limiting carbs, cutting out second dinner.  He denies any sugar sweetened beverages and limited simple carb intake.  Previously took metformin but stopped because he did not tolerate it.  Also notes that weight is up 6 pounds from last visit.  Last saw an eye doctor in October 2021, has a visit scheduled for this October.  Depression: States that mood has been stable since medication changes in February.  He has been on Wellbutrin for a long time with good effect, but has had a hard time finding a second medication that works well.  He has tried SSRIs, trazodone in the past with no benefit.  He says that mirtazapine has been the most helpful medication for him in this regard.  He is aware that it can increase his appetite and may be a contributing factor to his weight gain and worsening A1c.  Hx of Genital Herpes No recent flares. Does not have any Valtrex at this time. Requesting refill.    PERTINENT  PMH / PSH: DM2, vitamin D deficiency  OBJECTIVE:   BP 122/84   Pulse 92   Ht 5\' 9"  (1.753 m)   Wt 253 lb 8 oz (115 kg)   SpO2 97%   BMI 37.44 kg/m   Gen: Alert, NAD Cardio: RRR, no m/r/g Lungs: Normal work of breathing on room air Neuro: Sensation intact in distal extremities   ASSESSMENT/PLAN:   Genital herpes No recent flares, requesting refill of  Valtrex -Refill sent to pharmacy  Type 2 diabetes mellitus without complications (HCC) A1c 9.5 today.  Increased from 7.16 months ago.  Also noted 6 pound weight gain in same period.  Intolerant to metformin.  Dietary changes have been ineffective thus far.  Educated regarding Trulicity use. -We will send Trulicity 0.75 mg weekly -Follow-up in 4 weeks, likely will require up titration -Seeing eye doctor in October  Mild episode of recurrent major depressive disorder (HCC) Mood stable at this time on current regimen of Wellbutrin and mirtazapine.  He states that this has been the most effective medication regimen that he has been on since college.  Although mirtazapine's effect on his appetite may be contributing to his worsening diabetes. -We will continue current regimen for now, consider changes at next PCP visit  High risk heterosexual behavior No new partners, recent high risk behavior.  Patient prefers to be checked regularly. -GC/chlamydia, HIV, RPR, hep C  Vitamin D deficiency Had a normal vitamin D level 3 months ago.  Since that time has continued to take 50,000 IUs daily.  I have some concern that he may have a high vitamin D level now. -Stop supplementation at this time -Educated regarding dietary sources of vitamin D -Vitamin D level today     November, MD [facility]

## 2021-05-22 ENCOUNTER — Encounter: Payer: Self-pay | Admitting: Student

## 2021-05-22 ENCOUNTER — Other Ambulatory Visit (HOSPITAL_COMMUNITY)
Admission: RE | Admit: 2021-05-22 | Discharge: 2021-05-22 | Disposition: A | Discharge status: Home / Self Care | Payer: No Typology Code available for payment source | Source: Ambulatory Visit | Attending: Family Medicine | Admitting: Family Medicine

## 2021-05-22 ENCOUNTER — Other Ambulatory Visit: Payer: Self-pay

## 2021-05-22 ENCOUNTER — Ambulatory Visit (INDEPENDENT_AMBULATORY_CARE_PROVIDER_SITE_OTHER): Payer: No Typology Code available for payment source | Admitting: Student

## 2021-05-22 VITALS — BP 122/84 | HR 92 | Ht 69.0 in | Wt 253.5 lb

## 2021-05-22 DIAGNOSIS — E119 Type 2 diabetes mellitus without complications: Secondary | ICD-10-CM

## 2021-05-22 DIAGNOSIS — F330 Major depressive disorder, recurrent, mild: Secondary | ICD-10-CM

## 2021-05-22 DIAGNOSIS — E559 Vitamin D deficiency, unspecified: Secondary | ICD-10-CM

## 2021-05-22 DIAGNOSIS — B009 Herpesviral infection, unspecified: Secondary | ICD-10-CM | POA: Diagnosis not present

## 2021-05-22 DIAGNOSIS — Z7251 High risk heterosexual behavior: Secondary | ICD-10-CM | POA: Insufficient documentation

## 2021-05-22 DIAGNOSIS — A6002 Herpesviral infection of other male genital organs: Secondary | ICD-10-CM

## 2021-05-22 LAB — POCT GLYCOSYLATED HEMOGLOBIN (HGB A1C): HbA1c, POC (controlled diabetic range): 9.5 % — AB (ref 0.0–7.0)

## 2021-05-22 MED ORDER — MIRTAZAPINE 30 MG PO TABS: 30.0000 mg | ORAL_TABLET | Freq: Every day | ORAL | 0 refills | Status: DC

## 2021-05-22 MED ORDER — VALACYCLOVIR HCL 1 G PO TABS: ORAL_TABLET | ORAL | 3 refills | Status: DC

## 2021-05-22 MED ORDER — TRULICITY 0.75 MG/0.5ML ~~LOC~~ SOAJ: 0.7500 mg | SUBCUTANEOUS | 0 refills | Status: DC

## 2021-05-22 NOTE — Assessment & Plan Note (Signed)
Mood stable at this time on current regimen of Wellbutrin and mirtazapine.  He states that this has been the most effective medication regimen that he has been on since college.  Although mirtazapine's effect on his appetite may be contributing to his worsening diabetes. -We will continue current regimen for now, consider changes at next PCP visit

## 2021-05-22 NOTE — Patient Instructions (Signed)
Mr. Jesse Shelton, It is such a joy to take care you! Thank you for coming in today.   As a reminder, here is a recap of what we talked about today:  - You should stop taking your vitamin D for now. We will recheck a level today and I will be in touch with those results - Your A1c is a bit higher than we would like today. We are starting you on a medicine called Trulicity that you will take once per week. If cost ends up being an issue, please give [place] a call and we can find an affordable alternative  We are checking some labs today. I will call you if they are abnormal. I will send you a MyChart message or a letter if they are normal.  If you do not hear about your labs in the next 2 weeks please let us know.  I recommend that you always bring your medications to each appointment as this makes it easy to ensure we are on the correct medications and helps [place] not miss when refills are needed.  Take care and seek immediate care sooner if you develop any concerns.   Eliezer Mccoy, MD [facility]

## 2021-05-22 NOTE — Assessment & Plan Note (Signed)
Had a normal vitamin D level 3 months ago.  Since that time has continued to take 50,000 IUs daily.  I have some concern that he may have a high vitamin D level now. -Stop supplementation at this time -Educated regarding dietary sources of vitamin D -Vitamin D level today

## 2021-05-22 NOTE — Assessment & Plan Note (Signed)
A1c 9.5 today.  Increased from 7.16 months ago.  Also noted 6 pound weight gain in same period.  Intolerant to metformin.  Dietary changes have been ineffective thus far.  Educated regarding Trulicity use. -We will send Trulicity 0.75 mg weekly -Follow-up in 4 weeks, likely will require up titration -Seeing eye doctor in October

## 2021-05-22 NOTE — Assessment & Plan Note (Signed)
No recent flares, requesting refill of Valtrex -Refill sent to pharmacy

## 2021-05-22 NOTE — Assessment & Plan Note (Signed)
No new partners, recent high risk behavior.  Patient prefers to be checked regularly. -GC/chlamydia, HIV, RPR, hep C

## 2021-05-23 LAB — URINE CYTOLOGY ANCILLARY ONLY
Chlamydia: NEGATIVE
Comment: NEGATIVE
Comment: NEGATIVE
Comment: NORMAL
Neisseria Gonorrhea: NEGATIVE
Trichomonas: NEGATIVE

## 2021-05-23 LAB — HIV ANTIBODY (ROUTINE TESTING W REFLEX): HIV Screen 4th Generation wRfx: NONREACTIVE

## 2021-05-23 LAB — VITAMIN D 25 HYDROXY (VIT D DEFICIENCY, FRACTURES): Vit D, 25-Hydroxy: 31.8 ng/mL (ref 30.0–100.0)

## 2021-05-23 LAB — RPR: RPR Ser Ql: NONREACTIVE

## 2021-05-23 LAB — HEPATITIS C ANTIBODY: Hep C Virus Ab: <0.1 s/co ratio (ref 0.0–0.9)

## 2021-06-25 ENCOUNTER — Ambulatory Visit (INDEPENDENT_AMBULATORY_CARE_PROVIDER_SITE_OTHER): Payer: No Typology Code available for payment source | Admitting: Family Medicine

## 2021-06-25 ENCOUNTER — Other Ambulatory Visit: Payer: Self-pay

## 2021-06-25 ENCOUNTER — Encounter: Payer: Self-pay | Admitting: Family Medicine

## 2021-06-25 DIAGNOSIS — E119 Type 2 diabetes mellitus without complications: Secondary | ICD-10-CM

## 2021-06-25 DIAGNOSIS — F330 Major depressive disorder, recurrent, mild: Secondary | ICD-10-CM

## 2021-06-25 MED ORDER — TRULICITY 0.75 MG/0.5ML ~~LOC~~ SOAJ: 0.7500 mg | SUBCUTANEOUS | 1 refills | Status: AC

## 2021-06-25 NOTE — Patient Instructions (Signed)
It was great seeing you today.  I filled out your paperwork for your diabetes visits.  I also sent a prescription for your Trulicity to your pharmacy.  I am not going to increase the dose today because we really need to know what your blood sugars have been.  Please go and get your blood sugar monitor so that when you meet with our pharmacy team you can let them know what your blood sugars have been.  Regarding your depression I want you to work on establishing with a psychiatrist.  If you have any questions or concerns please call the clinic.  I hope you have a wonderful afternoon!  Psychiatry Resource List (Adults and Children) Most of these providers will take Medicaid. please consult your insurance for a complete and updated list of available providers. When calling to make an appointment have your insurance information available to confirm you are covered.   [facility]:Psychiatry and Counseling [address] [place], [place] [address] [phone]  [facility]  [address] [place], [place] [phone] Crisis [phone]   [facility]:   [facility]: [address]     [phone]   Sidney Ace: [address] [place]. [place],        [phone] [place]: [address],    [phone] Kathryne Sharper: [address],                   [phone] Children: [facility] [address]         [phone]   [facility]  (Psychiatry only; Adults /children 12 and over, will take Medicaid)  [address] Laurell Josephs [address], [place], [place] [address]       [phone]   [facility] (Psychiatry & counseling ; adults & children ; will take Medicaid [address]  [place] [address]   Go on-line to complete referral ( [email or web address] [phone]   (Spanish therapist)  [facility]  Psychiatry & counseling; Adults and children;  Call Registration prior to scheduling an appointment [phone] [address]    [place], [place] [address]    [phone]  [facility] (Psychiatry & counseling; adults & children; Medicare no Medicaid)  [address]   [place], [place]  [address]      [phone]    [facility] (up to age 31)  Psychiatry & counseling ,will take Medicaid, must do counseling to receive psychiatry services  [address] [place] [address]        [phone]  [facility] (Psychiatry & counseling; adults & children; will take Medicaid) Will need a referral from provider [address],  [place], [place]  [phone]   [facility] --- Walk-In Mon-Friday 8am-3pm ( will take Medicaid, Psychiatry, Adults & children,  [address], [place], [place]   [phone]   [facility], Walk-in M-F 8am-12pm and 1pm -3pm   (Counseling, Psychiatry, will take Medicaid, adults & children)  [address], [place], [place]  [phone]

## 2021-06-25 NOTE — Assessment & Plan Note (Signed)
Hemoglobin A1c at previous visit was 9.5.  He was started on Trulicity 0.75 mg weekly but did not get any refills on his medications and has not taken it in over a week.  Sent in refill for patient's medications and he is going to check his blood sugars and follow-up in 1 month, consider increasing dose at that checkup.

## 2021-06-25 NOTE — Progress Notes (Signed)
    SUBJECTIVE:   CHIEF COMPLAINT / HPI:   Diabetes Patient reports that he is unsure how his diabetes has been doing.  He took his 4 weeks of Trulicity but there were no refills on the prescription ran out so he has not taken it in over a week.  He is also not been taking his blood glucoses because his blood glucose monitor is a Psychiatrist and he has not been able to get it yet.  He is planning on going to get the glucose monitor today.  Denies any hypoglycemic episodes that he knows of.  Depression  Patient reports that his depression and anxiety have been considerable recently.  He is currently extremely anxious about a trip he is taking to [place] for [employer].  He reports that he saw psychiatry in the past over at [facility] but has not been in 2 years.  Continues to take Remeron and Wellbutrin.  Denies any SI or HI.  OBJECTIVE:   BP (!) 137/98   Pulse (!) 103   Ht 5\' 9"  (1.753 m)   Wt 248 lb 9.6 oz (112.8 kg)   SpO2 98%   BMI 36.71 kg/m   General: Well-appearing, no acute distress Cardiac: Regular rate and rhythm, no murmurs appreciated Respiratory: Normal for breathing, lungs clear to auscultation bilaterally MSK: No gross abnormalities Psych: Intermittently anxious, especially when talking about speaking in public, traveling  ASSESSMENT/PLAN:   Type 2 diabetes mellitus without complications (HCC) Hemoglobin A1c at previous visit was 9.5.  He was started on Trulicity 0.75 mg weekly but did not get any refills on his medications and has not taken it in over a week.  Sent in refill for patient's medications and he is going to check his blood sugars and follow-up in 1 month, consider increasing dose at that checkup.  Mild episode of recurrent major depressive disorder [facility]) Patient reports his mood is doing well at this time and he is on Wellbutrin and mirtazapine.  He would like to start seeing psychiatry again but has not called to make an appointment.  Denies  any SI or HI.  Provided with a list of psychiatric providers and he reports he will call to schedule an appointment.  Continuing on current regimen at this time but consider changes from the mirtazapine given his diabetes.     [facility], MD [facility]

## 2021-06-25 NOTE — Assessment & Plan Note (Signed)
Patient reports his mood is doing well at this time and he is on Wellbutrin and mirtazapine.  He would like to start seeing psychiatry again but has not called to make an appointment.  Denies any SI or HI.  Provided with a list of psychiatric providers and he reports he will call to schedule an appointment.  Continuing on current regimen at this time but consider changes from the mirtazapine given his diabetes.

## 2021-06-28 ENCOUNTER — Other Ambulatory Visit (HOSPITAL_COMMUNITY): Payer: Self-pay

## 2021-06-28 ENCOUNTER — Encounter: Payer: Self-pay | Admitting: Family Medicine

## 2021-06-28 ENCOUNTER — Other Ambulatory Visit: Payer: Self-pay

## 2021-06-28 DIAGNOSIS — B009 Herpesviral infection, unspecified: Secondary | ICD-10-CM

## 2021-06-28 MED ORDER — VALACYCLOVIR HCL 1 G PO TABS
1000.0000 mg | ORAL_TABLET | Freq: Every day | ORAL | 3 refills | Status: DC
  Filled 2021-06-28 – 2021-07-02 (×2): qty 30, 30d supply, fill #0

## 2021-06-28 NOTE — Telephone Encounter (Signed)
Received mychart message regarding rx refill on Valtrex. On 8/24, rx was written, however, was not sent electronically. Resent electronically, per original order.   Veronda Prude, RN

## 2021-07-01 ENCOUNTER — Other Ambulatory Visit (HOSPITAL_COMMUNITY): Payer: Self-pay

## 2021-07-01 MED ORDER — VALACYCLOVIR HCL 1 G PO TABS: 1000.0000 mg | ORAL_TABLET | Freq: Every day | ORAL | 3 refills | Status: DC

## 2021-07-01 NOTE — Telephone Encounter (Signed)
Received message from patient that rx was supposed to be sent to the [facility] pharmacy. Called and canceled rx at [facility] pharmacy and resent to patient's preferred pharmacy.   Veronda Prude, RN

## 2021-07-02 ENCOUNTER — Other Ambulatory Visit (HOSPITAL_COMMUNITY): Payer: Self-pay

## 2021-08-09 ENCOUNTER — Encounter: Payer: Self-pay | Admitting: Family Medicine

## 2021-08-09 ENCOUNTER — Ambulatory Visit (INDEPENDENT_AMBULATORY_CARE_PROVIDER_SITE_OTHER): Payer: No Typology Code available for payment source | Admitting: Family Medicine

## 2021-08-09 ENCOUNTER — Other Ambulatory Visit: Payer: Self-pay

## 2021-08-09 VITALS — BP 132/78 | HR 105 | Ht 69.0 in | Wt 246.6 lb

## 2021-08-09 DIAGNOSIS — E119 Type 2 diabetes mellitus without complications: Secondary | ICD-10-CM

## 2021-08-09 LAB — POCT GLYCOSYLATED HEMOGLOBIN (HGB A1C): HbA1c, POC (controlled diabetic range): 6.6 % (ref 0.0–7.0)

## 2021-08-09 MED ORDER — TRULICITY 0.75 MG/0.5ML ~~LOC~~ SOAJ: 0.7500 mg | SUBCUTANEOUS | 3 refills | Status: DC

## 2021-08-09 NOTE — Assessment & Plan Note (Signed)
Hemoglobin A1c at previous check was 9.5.  He has continued on Trulicity and is well controlled.  Blood sugars ranging between 73 and low 200s.  Refill Trulicity prescription.  Hemoglobin A1c today was 6.6.  Patient also has had some weight loss.  We will continue to monitor and recheck in 3 months.  Strict return precautions given.  No further questions or concerns.

## 2021-08-09 NOTE — Progress Notes (Signed)
    SUBJECTIVE:   CHIEF COMPLAINT / HPI:   Diabetes checkup Patient presents for regular diabetes checkup.  Most recent hemoglobin A1c was 9.5.  Reports his blood sugars have been doing well.  Brings blood sugar log with him.  Blood sugars have ranged between 73 and 200.  Most morning blood sugars are in the low 100s.  Denies any symptomatic hypoglycemic episodes.  Currently on Trulicity once weekly and is tolerating the medication well.  No concerns at this time.  PERTINENT  PMH / PSH: Type 2 diabetes  OBJECTIVE:   BP 132/78   Pulse (!) 105   Ht 5\' 9"  (1.753 m)   Wt 246 lb 9.6 oz (111.9 kg)   SpO2 97%   BMI 36.42 kg/m   General: Well-appearing 40 year old male, no acute distress Cardiac: Regular rate and rhythm, no murmurs appreciated Respiratory: Normal work of breathing, lungs clear to auscultation bilaterally Abdomen: Soft, nontender, positive bowel sounds MSK: No gross abnormalities   ASSESSMENT/PLAN:   Type 2 diabetes mellitus without complications (HCC) Hemoglobin A1c at previous check was 9.5.  He has continued on Trulicity and is well controlled.  Blood sugars ranging between 73 and low 200s.  Refill Trulicity prescription.  Hemoglobin A1c today was 6.6.  Patient also has had some weight loss.  We will continue to monitor and recheck in 3 months.  Strict return precautions given.  No further questions or concerns.  20, MD [facility]

## 2021-08-09 NOTE — Patient Instructions (Addendum)
It is great seeing you today.  Congratulations on your weight loss as well as the improvement in your hemoglobin A1c.  I sent a refill of your medication to your pharmacy.  I would like to see you in 3 months for routine diabetes checkup.  If you have any questions or concerns call the clinic.  I hope you have a great afternoon!

## 2021-10-09 ENCOUNTER — Telehealth: Payer: Self-pay

## 2021-10-09 NOTE — Telephone Encounter (Signed)
Received approval for Trulicity.   Approved Request Reference Number: [id number]. TRULICITY INJ 0.75/0.5 is approved through 10/09/2022. Your patient may now fill this prescription and it will be covered.  Veronda Prude, RN

## 2021-10-09 NOTE — Telephone Encounter (Signed)
Received fax from pharmacy, PA needed on Trulicity.  Clinical questions submitted via Cover My Meds.  Waiting on response, could take up to 72 hours.  Cover My Meds info: Key: [id number]  Veronda Prude, RN

## 2021-11-08 ENCOUNTER — Encounter: Payer: Self-pay | Admitting: Family Medicine

## 2021-11-18 ENCOUNTER — Encounter: Payer: No Typology Code available for payment source | Admitting: Family Medicine

## 2021-11-18 ENCOUNTER — Ambulatory Visit (INDEPENDENT_AMBULATORY_CARE_PROVIDER_SITE_OTHER): Payer: PRIVATE HEALTH INSURANCE | Admitting: Family Medicine

## 2021-11-18 ENCOUNTER — Encounter: Payer: Self-pay | Admitting: Family Medicine

## 2021-11-18 ENCOUNTER — Other Ambulatory Visit (HOSPITAL_COMMUNITY)
Admission: RE | Admit: 2021-11-18 | Discharge: 2021-11-18 | Disposition: A | Discharge status: Home / Self Care | Payer: PRIVATE HEALTH INSURANCE | Source: Ambulatory Visit | Attending: Family Medicine | Admitting: Family Medicine

## 2021-11-18 ENCOUNTER — Other Ambulatory Visit: Payer: Self-pay

## 2021-11-18 VITALS — BP 129/86 | HR 90 | Ht 69.0 in | Wt 234.0 lb

## 2021-11-18 DIAGNOSIS — E1169 Type 2 diabetes mellitus with other specified complication: Secondary | ICD-10-CM

## 2021-11-18 DIAGNOSIS — Z113 Encounter for screening for infections with a predominantly sexual mode of transmission: Secondary | ICD-10-CM

## 2021-11-18 DIAGNOSIS — E119 Type 2 diabetes mellitus without complications: Secondary | ICD-10-CM | POA: Diagnosis not present

## 2021-11-18 DIAGNOSIS — B009 Herpesviral infection, unspecified: Secondary | ICD-10-CM

## 2021-11-18 DIAGNOSIS — Z0000 Encounter for general adult medical examination without abnormal findings: Secondary | ICD-10-CM

## 2021-11-18 DIAGNOSIS — N529 Male erectile dysfunction, unspecified: Secondary | ICD-10-CM

## 2021-11-18 DIAGNOSIS — F330 Major depressive disorder, recurrent, mild: Secondary | ICD-10-CM

## 2021-11-18 DIAGNOSIS — E785 Hyperlipidemia, unspecified: Secondary | ICD-10-CM

## 2021-11-18 DIAGNOSIS — A6002 Herpesviral infection of other male genital organs: Secondary | ICD-10-CM

## 2021-11-18 LAB — POCT GLYCOSYLATED HEMOGLOBIN (HGB A1C): HbA1c, POC (prediabetic range): 6.2 % (ref 5.7–6.4)

## 2021-11-18 LAB — POCT URINALYSIS DIP (CLINITEK)
Bilirubin, UA: NEGATIVE
Blood, UA: NEGATIVE
Glucose, UA: NEGATIVE mg/dL
Ketones, POC UA: NEGATIVE mg/dL
Leukocytes, UA: NEGATIVE
Nitrite, UA: NEGATIVE
POC PROTEIN,UA: NEGATIVE
Spec Grav, UA: 1.025 (ref 1.010–1.025)
Urobilinogen, UA: 0.2 E.U./dL
pH, UA: 5.5 (ref 5.0–8.0)

## 2021-11-18 MED ORDER — SILDENAFIL CITRATE 100 MG PO TABS: ORAL_TABLET | ORAL | 2 refills | Status: DC

## 2021-11-18 MED ORDER — VALACYCLOVIR HCL 1 G PO TABS: 1000.0000 mg | ORAL_TABLET | Freq: Every day | ORAL | 3 refills | Status: DC

## 2021-11-18 MED ORDER — TRULICITY 0.75 MG/0.5ML ~~LOC~~ SOAJ: 0.7500 mg | SUBCUTANEOUS | 3 refills | Status: DC

## 2021-11-18 NOTE — Assessment & Plan Note (Signed)
Refill sildenafil. 

## 2021-11-18 NOTE — Assessment & Plan Note (Signed)
No acute concerns today. -STI screening -Lipid panel -Follow-up with PCP as needed

## 2021-11-18 NOTE — Progress Notes (Signed)
° ° °  SUBJECTIVE:   CHIEF COMPLAINT / HPI: Annual physical  No acute concerns today.  DM type II Recently started Trulicity 0.75 mg weekly and tolerating well.  Denies any headaches, visual changes, chest pain, shortness of breath, nausea or vomiting, abdominal pain, polyuria or polydipsia.  CBGs at home 130 random.  Hyperlipidemia Has not been taking atorvastatin 40 mg daily.    Mood disorder Reports being followed by mental health provider at [facility] who prescribes Wellbutrin and mirtazapine.  Denies any SI/HI.  Genital herpes No current outbreak.  Has run out of medication.  ED Questing refill for requesting refill for Viagra  PERTINENT  PMH / PSH:  DM type II Hyperlipidemia   OBJECTIVE:   BP 129/86    Pulse 90    Ht 5\' 9"  (1.753 m)    Wt 234 lb (106.1 kg)    SpO2 99%    BMI 34.56 kg/m    General: Alert, no acute distress Cardio: Normal S1 and S2, RRR, no r/m/g Pulm: CTAB, normal work of breathing Abdomen: Bowel sounds normal. Abdomen soft and non-tender.  Diabetic foot exam: No deformities, ulcerations, or other skin breakdown on feet bilaterally.  Sensation intact to monofilament and light touch.  PT and DP pulses intact BL.    ASSESSMENT/PLAN:   Annual physical exam No acute concerns today. -STI screening -Lipid panel -Follow-up with PCP as needed  Type 2 diabetes mellitus without complications (HCC) A1c 6.2, decreased from last visit in November.  Asymptomatic -Refill Trulicity 0.75 mg weekly -BMet, urine micro albumin creatinine -Diabetic foot exam completed -Last eye exam October 2022 -Follow-up with PCP as needed  Hyperlipidemia associated with type 2 diabetes mellitus (HCC) Has not been taking Lipitor in a while. -Lipid panel today -Follow-up results.  Mild episode of recurrent major depressive disorder (HCC) PHQ-9 score 4.  No SI/HI.  Follows with [facility] [employer] mental health who reorders Wellbutrin and mirtazapine. -Follow-up with [place] mental  health provider as scheduled -Follow-up with PCP as needed  ED (erectile dysfunction) of organic origin Refill sildenafil   Genital herpes Refill Valtrex     November 2022, MD [facility]

## 2021-11-18 NOTE — Patient Instructions (Addendum)
Thank you for coming to see me today. It was a pleasure.   We will get some labs today.  If they are abnormal or we need to do something about them, I will call you.  If they are normal, I will send you a message on MyChart (if it is active) or a letter in the mail.  If you don't hear from [place] in 2 weeks, please call the office at the number below.   You are due for an eye exam in October of 2023. Please make sure that you call your eye doctor to have this scheduled and have them fax the results to our office.   If you would like some assistance to help quit smoking please schedule an appointment with PCP to discuss the many ways that we can assist you if and when you decide you are ready to take the next step toward a smoke free lifestyle.   You should pay attention to your hemoglobin A1C.  It is a three month test about your average blood sugar. If the A1C is - <7.0 is great.  That is our goal for treating you. - Between 7.0 and 9.0 is not so good.  We would need to work to do better. - Above 9.0 is terrible.  You would really need to work with [place] to get it under control.    Today's A1C = 6.2.  Your last A1c was 6.6 back in November 2022  Refills sent for requested medications today.  Please schedule appointment with your primary care doctor for diabetic management in 2 weeks  If you have any questions or concerns, please do not hesitate to call the office at [phone].  Best,   Carollee Leitz, MD

## 2021-11-18 NOTE — Assessment & Plan Note (Signed)
Refill Valtrex

## 2021-11-18 NOTE — Assessment & Plan Note (Signed)
PHQ-9 score 4.  No SI/HI.  Follows with [facility] who reorders Wellbutrin and mirtazapine. -Follow-up with [facility] mental health provider as scheduled -Follow-up with PCP as needed

## 2021-11-18 NOTE — Assessment & Plan Note (Signed)
A1c 6.2, decreased from last visit in November.  Asymptomatic -Refill Trulicity 0.75 mg weekly -BMet, urine micro albumin creatinine -Diabetic foot exam completed -Last eye exam October 2022 -Follow-up with PCP as needed

## 2021-11-18 NOTE — Assessment & Plan Note (Signed)
Has not been taking Lipitor in a while. -Lipid panel today -Follow-up results.

## 2021-11-19 LAB — LIPID PANEL
Chol/HDL Ratio: 4.6 ratio (ref 0.0–5.0)
Cholesterol, Total: 165 mg/dL (ref 100–199)
HDL: 36 mg/dL — ABNORMAL LOW (ref >39)
LDL Chol Calc (NIH): 101 mg/dL — ABNORMAL HIGH (ref 0–99)
Triglycerides: 160 mg/dL — ABNORMAL HIGH (ref 0–149)
VLDL Cholesterol Cal: 28 mg/dL (ref 5–40)

## 2021-11-19 LAB — BASIC METABOLIC PANEL
BUN/Creatinine Ratio: 8 — ABNORMAL LOW (ref 9–20)
BUN: 10 mg/dL (ref 6–20)
CO2: 21 mmol/L (ref 20–29)
Calcium: 9.6 mg/dL (ref 8.7–10.2)
Chloride: 106 mmol/L (ref 96–106)
Creatinine, Ser: 1.23 mg/dL (ref 0.76–1.27)
Glucose: 106 mg/dL — ABNORMAL HIGH (ref 70–99)
Potassium: 4.3 mmol/L (ref 3.5–5.2)
Sodium: 143 mmol/L (ref 134–144)
eGFR: 77 mL/min/{1.73_m2} (ref >59)

## 2021-11-19 LAB — URINE CYTOLOGY ANCILLARY ONLY
Chlamydia: NEGATIVE
Comment: NEGATIVE
Comment: NORMAL
Neisseria Gonorrhea: NEGATIVE

## 2021-11-19 LAB — MICROALBUMIN / CREATININE URINE RATIO
Creatinine, Urine: 222.9 mg/dL
Microalb/Creat Ratio: <1 mg/g creat (ref 0–29)
Microalbumin, Urine: <3 ug/mL

## 2021-11-19 LAB — HIV ANTIBODY (ROUTINE TESTING W REFLEX): HIV Screen 4th Generation wRfx: NONREACTIVE

## 2021-11-19 LAB — T PALLIDUM ANTIBODY, EIA: T pallidum Antibody, EIA: NEGATIVE

## 2021-11-19 LAB — RPR W/REFLEX TO TREPSURE: RPR: NONREACTIVE

## 2021-11-20 ENCOUNTER — Encounter: Payer: Self-pay | Admitting: Family Medicine

## 2021-11-24 ENCOUNTER — Encounter: Payer: Self-pay | Admitting: Family Medicine

## 2021-12-10 ENCOUNTER — Other Ambulatory Visit: Payer: Self-pay

## 2021-12-10 ENCOUNTER — Ambulatory Visit (INDEPENDENT_AMBULATORY_CARE_PROVIDER_SITE_OTHER): Payer: No Typology Code available for payment source | Admitting: Psychologist

## 2021-12-10 DIAGNOSIS — F330 Major depressive disorder, recurrent, mild: Secondary | ICD-10-CM

## 2021-12-10 NOTE — Progress Notes (Signed)
[facility] Counselor Initial Adult Exam ? ?Name: Jesse Shelton ?Date: 12/10/2021 ?MRN: [id number] ?DOB: Sep 15, 1981 ?PCP: Derrel Nip, MD ? ?Time spent: 10:03 am to 10:22 am; total time: 19 minutes ? ?This session was held via in person. The patient consented to in-person therapy and was in the clinician's office. Limits of confidentiality were discussed with the patient.  ? ?Guardian/Payee:  NA   ? ?Paperwork requested: No  ? ?Reason for Visit /Presenting Problem: Depression ? ?Mental Status Exam: ?Appearance:   Casual     ?Behavior:  Evasive  ?Motor:  Normal  ?Speech/Language:   Clear and Coherent  ?Affect:  Flat  ?Mood:  normal  ?Thought process:  normal  ?Thought content:    WNL  ?Sensory/Perceptual disturbances:    WNL  ?Orientation:  oriented to person, place, and time/date  ?Attention:  Good  ?Concentration:  Good  ?Memory:  WNL  ?Fund of knowledge:   Fair  ?Insight:    Poor  ?Judgment:   Fair  ?Impulse Control:  Good  ? ? ?Reported Symptoms:  The patient endorsed experiencing the following: feeling down, sad, lack of motivation, low self-esteem, social isolation, avoiding pleasurable activities, and rumination of negative thoughts. He denied suicidal and homicidal ideation.  ? ?Risk Assessment: ?Danger to Self:  No ?Self-injurious Behavior: No ?Danger to Others: No ?Duty to Warn:no ?Physical Aggression / Violence:No  ?Access to Firearms a concern: No  ?Gang Involvement:No  ?Patient / guardian was educated about steps to take if suicide or homicide risk level increases between visits: n/a ?While future psychiatric events cannot be accurately predicted, the patient does not currently require acute inpatient psychiatric care and does not currently meet [facility] involuntary commitment criteria. ? ?Substance Abuse History: ?Current substance abuse: No    ? ?Past Psychiatric History:   ?Previous psychological history is significant for depression ?Outpatient Providers:Dr.  Dellia Cloud ?History of Psych Hospitalization: No  ?Psychological Testing:  NA   ? ?Abuse History:  ?Victim of: No.,  NA    ?Report needed: No. ?Victim of Neglect:No. ?Perpetrator of  NA   ?Witness / Exposure to Domestic Violence: No   ?Protective Services Involvement: No  ?Witness to [employer] Violence:  No  ? ?Family History:  ?Family History  ?Problem Relation Age of Onset  ? Cancer Maternal Grandmother   ?     pancreatic cancer  ? Depression Maternal Grandmother   ? Diabetes Maternal Grandmother   ? Depression Maternal Aunt   ? Diabetes Maternal Aunt   ? ? ?Living situation: the patient lives with their family ? ?Sexual Orientation: Straight ? ?Relationship Status: married  ?Name of spouse / other:Tialia ?If a parent, number of children / ages:Patient stated that he has two children.  ? ?Support Systems: Patient stated that he has two friends.  ? ?Financial Stress:  No  ? ?Income/Employment/Disability: Employment ? ?Military Service: No  ? ?Educational History: ?Education: Scientist, [employer] college ? ?Religion/Sprituality/World View: ?Denied ? ?Any cultural differences that may affect / interfere with treatment:  not applicable  ? ?Recreation/Hobbies: Attending comic con conventions ? ?Stressors: Other: Patient just described experiencing stressors   ? ?Strengths: Supportive Relationships ? ?Barriers:  Patient presented as being guarded and having a difficult time opening up  ? ?Legal History: ?Pending legal issue / charges: The patient has no significant history of legal issues. ?History of legal issue / charges:  Patient was arrested in 2005 for domestic violence ? ?Medical History/Surgical History: reviewed ?Past Medical History:  ?Diagnosis Date  ?  Anxiety   ? Cholelithiasis   ? Depression   ? Herpes   ? Insomnia   ? Sleep apnea   ? ? ?Past Surgical History:  ?Procedure Laterality Date  ? BONE MARROW HARVEST  09/2011  ? CHOLECYSTECTOMY N/A 08/19/2018  ? Procedure: LAPAROSCOPIC CHOLECYSTECTOMY;  Surgeon: Luretha Murphy, MD;  Location: [facility];  Service: General;  Laterality: N/A;  ? ELBOW SURGERY  1996  ? RIGHT  ? HERNIA REPAIR N/A 2002  ? Ventral  ? ? ?Medications: ?Current Outpatient Medications  ?Medication Sig Dispense Refill  ? buPROPion (WELLBUTRIN XL) 300 MG 24 hr tablet Take 1 tablet (300 mg total) by mouth daily. 90 tablet 3  ? Dulaglutide (TRULICITY) 0.75 MG/0.5ML SOPN Inject 0.75 mg into the skin once a week. 2 mL 3  ? mirtazapine (REMERON) 45 MG tablet Take by mouth.    ? sildenafil (VIAGRA) 100 MG tablet TAKE 1 TABLET 1 HOUR BEFORE INTERCOURSE DAILY AS NEEDED 20 tablet 2  ? valACYclovir (VALTREX) 1000 MG tablet Take 1 tablet (1,000 mg total) by mouth daily for 5 days within 1 day of lesion as needed. 90 tablet 3  ? ?No current facility-administered medications for this visit.  ? ? ?Allergies  ?Allergen Reactions  ? Penicillins Other (See Comments)  ?  Has patient had a PCN reaction causing immediate rash, facial/tongue/throat swelling, SOB or lightheadedness with hypotension: Unknown ?Has patient had a PCN reaction causing severe rash involving mucus membranes or skin necrosis: Unknown ?Has patient had a PCN reaction that required hospitalization: No ?Has patient had a PCN reaction occurring within the last 10 years: No ?If all of the above answers are "NO", then may proceed with Cephalosporin use.  ? ? ?Diagnoses:  ?F33.0 major depressive affective disorder, recurrent, mild ? ?Plan of Care: The patient is a 41 year old Black male who was referred due to experiencing depression. The patient lives at home with his wife, two children, and several pets. The patient meets criteria for a diagnosis of F33.0 major depressive affective disorder, recurrent, mild based off of the following: feeling down, sad, lack of motivation, low self-esteem, social isolation, avoiding pleasurable activities, and rumination of negative thoughts. He denied suicidal and homicidal ideation.  ? ?The patient stated that he wants to  experience less depression. Elaborating, the patient stated that he wants to focus on self and not others ? ?This psychologist makes the recommendation that the patient participate in counseling at least once a month.  ? ?Hilbert Corrigan, PsyD  ? ? ? ?

## 2021-12-10 NOTE — Plan of Care (Signed)

## 2021-12-10 NOTE — Progress Notes (Signed)
                Genea Rheaume, PsyD 

## 2022-01-01 ENCOUNTER — Ambulatory Visit (INDEPENDENT_AMBULATORY_CARE_PROVIDER_SITE_OTHER): Payer: PRIVATE HEALTH INSURANCE | Admitting: Psychologist

## 2022-01-01 DIAGNOSIS — F330 Major depressive disorder, recurrent, mild: Secondary | ICD-10-CM

## 2022-01-01 NOTE — Progress Notes (Signed)
                Lyndsey Demos, PsyD 

## 2022-01-01 NOTE — Progress Notes (Signed)
[facility] Counselor/Therapist Progress Note ? ?Patient ID: Jesse Shelton, MRN: [id number],   ? ?Date: 01/01/2022 ? ?Time Spent: 9:07 am to 9:47 am; total time: 40 minutes  ? ?Treatment Type: Individual Therapy ? ?This session was held via in person. The patient consented to in-person therapy and was in the clinician's office. Limits of confidentiality were discussed with the patient.  ? ?Reported Symptoms: Depression and conflict at home ? ?Mental Status Exam: ?Appearance:  Casual     ?Behavior: Appropriate  ?Motor: Normal  ?Speech/Language:  Clear and Coherent  ?Affect: Appropriate  ?Mood: normal  ?Thought process: normal  ?Thought content:   WNL  ?Sensory/Perceptual disturbances:   WNL  ?Orientation: oriented to person, place, and time/date  ?Attention: Good  ?Concentration: Good  ?Memory: WNL  ?Fund of knowledge:  Good  ?Insight:   Fair  ?Judgment:  Good  ?Impulse Control: Good  ? ?Risk Assessment: ?Danger to Self:  No ?Self-injurious Behavior: No ?Danger to Others: No ?Duty to Warn:no ?Physical Aggression / Violence:No  ?Access to Firearms a concern: No  ?Gang Involvement:No  ? ?Subjective: Beginning the session, patient described himself as doing poorly. After reviewing the treatment plan, patient reflected on events that are contributing to him doing poorly. Specifically, he voiced experiencing conflict with his wife and avoiding her as well as experiencing challenges with his children. Patient voiced that he is currently looking for a separate place to live. He processed thoughts and emotions. He was agreeable to homework and following up. He denied suicidal and homicidal ideation.   ? ?Interventions:  Worked on developing a therapeutic relationship with the patient using active listening and reflective statements. Provided emotional support using empathy and validation. Reviewed the treatment plan with the patient. Reviewed events since the intake. Explored the etiology of distress that  was expressed. Processed the interpersonal relationship dynamics at home. Used socratic questions to assist the patient gain insight into self. Attempted to assist with problem solving. Normalized and validated expressed thoughts and emotions. Identified several themes and processed the emotions. Explored writing a letter to emotions. Assessed for suicidal and homicidal ideation. ? ?Homework: Write letter to self, and children ? ?Next Session: Review homework and emotional support ? ?Diagnosis: F33.0 major depressive affective disorder, recurrent, mild ? ?Plan:  ? ?Goals ?Alleviate depressive symptoms ?Recognize, accept, and cope with depressive feelings ?Develop healthy thinking patterns ?Develop healthy interpersonal relationships ? ?Objectives target date for all objectives is 12/11/2022.  ?Cooperate with a medication evaluation by a physician ?Verbalize an accurate understanding of depression ?Verbalize an understanding of the treatment ?Identify and replace thoughts that support depression ?Learn and implement behavioral strategies ?Verbalize an understanding and resolution of current interpersonal problems ?Learn and implement problem solving and decision making skills ?Learn and implement conflict resolution skills to resolve interpersonal problems ?Verbalize an understanding of healthy and unhealthy emotions verbalize insight into how past relationships may be influence current experiences with depression ?Use mindfulness and acceptance strategies and increase value based behavior  ?Increase hopeful statements about the future.  ?Interventions ?Consistent with treatment model, discuss how change in cognitive, behavioral, and interpersonal can help client alleviate depression ?CBT ?Behavioral activation help the client explore the relationship, nature of the dispute,  ?Help the client develop new interpersonal skills and relationships ?Conduct Problem so living therapy ?Teach conflict resolution skills ?Use a  process-experiential approach ?Conduct TLDP ?Conduct ACT ?Evaluate need for psychotropic medication ?Monitor adherence to medication  ? ?The patient and clinician reviewed the treatment plan on  01/01/2022. The patient approved of the treatment plan.  ? ?Conception Chancy, PsyD ? ? ? ?

## 2022-01-03 ENCOUNTER — Encounter: Payer: Self-pay | Admitting: Family Medicine

## 2022-01-09 ENCOUNTER — Encounter: Payer: Self-pay | Admitting: Family Medicine

## 2022-01-09 ENCOUNTER — Ambulatory Visit (INDEPENDENT_AMBULATORY_CARE_PROVIDER_SITE_OTHER): Payer: PRIVATE HEALTH INSURANCE | Admitting: Family Medicine

## 2022-01-09 NOTE — Progress Notes (Signed)
Patient roomed but no need for actual appointment.  No charge visit. ?

## 2022-01-29 ENCOUNTER — Ambulatory Visit (INDEPENDENT_AMBULATORY_CARE_PROVIDER_SITE_OTHER): Payer: No Typology Code available for payment source | Admitting: Psychologist

## 2022-01-29 DIAGNOSIS — F330 Major depressive disorder, recurrent, mild: Secondary | ICD-10-CM

## 2022-01-29 NOTE — Progress Notes (Signed)
[facility] Counselor/Therapist Progress Note ? ?Patient ID: Jesse Shelton, MRN: [id number],   ? ?Date: 01/29/2022 ? ?Time Spent: 2:07 pm to 2:45 pm; total time: 38 minutes ? ?Treatment Type: Individual Therapy ? ?This session was held via in person. The patient consented to in-person therapy and was in the clinician's office. Limits of confidentiality were discussed with the patient.  ? ?Reported Symptoms: Depression and stress related to recent events ? ?Mental Status Exam: ?Appearance:  Casual     ?Behavior: Appropriate  ?Motor: Normal  ?Speech/Language:  Clear and Coherent  ?Affect: Appropriate  ?Mood: normal  ?Thought process: normal  ?Thought content:   WNL  ?Sensory/Perceptual disturbances:   WNL  ?Orientation: oriented to person, place, and time/date  ?Attention: Good  ?Concentration: Good  ?Memory: WNL  ?Fund of knowledge:  Good  ?Insight:   Fair  ?Judgment:  Good  ?Impulse Control: Good  ? ?Risk Assessment: ?Danger to Self:  No ?Self-injurious Behavior: No ?Danger to Others: No ?Duty to Warn:no ?Physical Aggression / Violence:No  ?Access to Firearms a concern: No  ?Gang Involvement:No  ? ?Subjective: Beginning the session, patient described himself as okay while indicating that he has moved out of the home. Patient spent some of the session reflecting on feeling stress about finances. He explored different ways to address his financial concerns. He spent some of the time reflecting on not getting support that he needs from those around him. He reflected on this concept and explored ways to get emotional needs met. Patient was agreeable to the plan discussed regarding referring to individuals who could see him more consistently for the time being. Patient stated that he wants to be seen more consistently and agreed with the plan discussed. He denied suicidal and homicidal ideation.   ? ?Interventions:  Worked on developing a therapeutic relationship with the patient using active listening  and reflective statements. Provided emotional support using empathy and validation. Used summary statements. Praised the patient for moving out. Reflected on the decision to move out. Normalized and validated expressed thoughts. Used a decisional analysis regarding paying old land lord versus not paying. Challenged some of the thoughts expressed. Used socratic questions to assist the patient gain insight into self. Provided psychoeducation about social services that could assist with offsetting financial concerns. Processed the lack of social support. Pointed out some of the inconsistencies expressed by the patient related to social support. Provided psychoeducation about providing clear concrete expectations for meeting social needs from others. Reflected on the idea of needing more counseling more consistently. Explored the idea of referring to people in the community who may be better able to meet more consistently with the patient. Assessed for suicidal and homicidal ideation. ? ?Homework: Contact social work at PCP office regarding looking into financial assistance.  ? ?Next Session: NA. Patient open to being referred out due to wanting to be seen more consistently and patient agreed to the plan.  ? ?Diagnosis: F33.0 major depressive affective disorder, recurrent, mild ? ?Plan:  ? ?Goals ?Alleviate depressive symptoms ?Recognize, accept, and cope with depressive feelings ?Develop healthy thinking patterns ?Develop healthy interpersonal relationships ? ?Objectives target date for all objectives is 12/11/2022.  ?Cooperate with a medication evaluation by a physician ?Verbalize an accurate understanding of depression ?Verbalize an understanding of the treatment ?Identify and replace thoughts that support depression ?Learn and implement behavioral strategies ?Verbalize an understanding and resolution of current interpersonal problems ?Learn and implement problem solving and decision making skills ?Learn and implement  conflict resolution skills to resolve interpersonal problems ?Verbalize an understanding of healthy and unhealthy emotions verbalize insight into how past relationships may be influence current experiences with depression ?Use mindfulness and acceptance strategies and increase value based behavior  ?Increase hopeful statements about the future.  ?Interventions ?Consistent with treatment model, discuss how change in cognitive, behavioral, and interpersonal can help client alleviate depression ?CBT ?Behavioral activation help the client explore the relationship, nature of the dispute,  ?Help the client develop new interpersonal skills and relationships ?Conduct Problem so living therapy ?Teach conflict resolution skills ?Use a process-experiential approach ?Conduct TLDP ?Conduct ACT ?Evaluate need for psychotropic medication ?Monitor adherence to medication  ? ?The patient and clinician reviewed the treatment plan on 01/01/2022. The patient approved of the treatment plan.  ? ?Conception Chancy, PsyD ? ? ?

## 2022-02-14 ENCOUNTER — Ambulatory Visit (INDEPENDENT_AMBULATORY_CARE_PROVIDER_SITE_OTHER): Payer: PRIVATE HEALTH INSURANCE | Admitting: Family Medicine

## 2022-02-14 ENCOUNTER — Encounter: Payer: Self-pay | Admitting: Family Medicine

## 2022-02-14 ENCOUNTER — Other Ambulatory Visit (HOSPITAL_COMMUNITY)
Admission: RE | Admit: 2022-02-14 | Discharge: 2022-02-14 | Disposition: A | Discharge status: Home / Self Care | Payer: PRIVATE HEALTH INSURANCE | Source: Ambulatory Visit | Attending: Family Medicine | Admitting: Family Medicine

## 2022-02-14 VITALS — BP 128/89 | HR 99 | Ht 69.0 in | Wt 234.6 lb

## 2022-02-14 DIAGNOSIS — Z113 Encounter for screening for infections with a predominantly sexual mode of transmission: Secondary | ICD-10-CM

## 2022-02-14 DIAGNOSIS — Z720 Tobacco use: Secondary | ICD-10-CM

## 2022-02-14 DIAGNOSIS — Z1159 Encounter for screening for other viral diseases: Secondary | ICD-10-CM

## 2022-02-14 DIAGNOSIS — Z7251 High risk heterosexual behavior: Secondary | ICD-10-CM | POA: Diagnosis not present

## 2022-02-14 DIAGNOSIS — Z114 Encounter for screening for human immunodeficiency virus [HIV]: Secondary | ICD-10-CM

## 2022-02-14 DIAGNOSIS — Z139 Encounter for screening, unspecified: Secondary | ICD-10-CM

## 2022-02-14 DIAGNOSIS — E119 Type 2 diabetes mellitus without complications: Secondary | ICD-10-CM

## 2022-02-14 LAB — POCT GLYCOSYLATED HEMOGLOBIN (HGB A1C): HbA1c, POC (controlled diabetic range): 6.2 % (ref 0.0–7.0)

## 2022-02-14 MED ORDER — TRULICITY 0.75 MG/0.5ML ~~LOC~~ SOAJ: 0.7500 mg | SUBCUTANEOUS | 3 refills | Status: DC

## 2022-02-14 NOTE — Progress Notes (Signed)
    SUBJECTIVE:   CHIEF COMPLAINT / HPI:   Diabetes checkup Patient presents today for diabetes checkup.  Most recent hemoglobin A1c 6.2.  Patient was started on Trulicity 0.75 mg a few visits ago.  Patient reports he has been compliant with his medications and feels good about how he is doing with his diabetes.  Does not regularly check his blood sugars but denies any hypoglycemic events.  STD screening Patient recently sexually active with new partners without protection.  Would like screening for STDs.  Denies any symptoms.  No known contact with someone having an STD.  Would like HIV and RPR as well.  Smoking cessation Patient reports he is currently smoking more than he normally does.  He is extremely stressed from divorce which she is going through at this time.  Is eager to decrease the amount of smoking quit but not right at this minute.  We will continue to discuss.  OBJECTIVE:   BP 128/89   Pulse 99   Ht 5\' 9"  (1.753 m)   Wt 234 lb 9.6 oz (106.4 kg)   SpO2 100%   BMI 34.64 kg/m   General: Pleasant 41 year old male in no acute distress Cardiac: Regular rate and rhythm, no murmurs appreciated Respiratory: Normal work of breathing, lungs clear to auscultation bilaterally Abdomen: Soft, nontender, positive bowel sounds MSK: No gross abnormalities  ASSESSMENT/PLAN:   Type 2 diabetes mellitus without complications (HCC) Hemoglobin A1c today stable at 6.2.  Trulicity refilled today.  May need further labs at this time.  Follow-up in 3 months for repeat hemoglobin A1c and medication adjustment if needed.  High risk heterosexual behavior Patient reports he has had new partners and would like screening for STDs.  Patient regularly gets screening.  GC/chlamydia, HIV, RPR collected today.  We will discuss results with patient when they come back and treat as needed.  Tobacco use Increase smoking over the past few months due to stress from divorce.  Is interested in quitting but  not at this time.  We will continue to encourage cessation.     26, MD [facility]

## 2022-02-14 NOTE — Assessment & Plan Note (Signed)
Increase smoking over the past few months due to stress from divorce.  Is interested in quitting but not at this time.  We will continue to encourage cessation.

## 2022-02-14 NOTE — Assessment & Plan Note (Signed)
Patient reports he has had new partners and would like screening for STDs.  Patient regularly gets screening.  GC/chlamydia, HIV, RPR collected today.  We will discuss results with patient when they come back and treat as needed.

## 2022-02-14 NOTE — Patient Instructions (Signed)
It was great seeing you today!  Your hemoglobin A1c was 6.2.  I will continue your current medication regimen.  We are checking some lab work and other test today.  I will call you with those results if there are any abnormalities or send you a MyChart message if everything looks okay.  If you have any questions or concerns call the clinic.  I hope you have a great day!

## 2022-02-14 NOTE — Assessment & Plan Note (Addendum)
Hemoglobin A1c today stable at 6.2.  Trulicity refilled today.  May need further labs at this time.  Follow-up in 3 months for repeat hemoglobin A1c and medication adjustment if needed.

## 2022-02-15 LAB — RPR: RPR Ser Ql: NONREACTIVE

## 2022-02-15 LAB — HIV ANTIBODY (ROUTINE TESTING W REFLEX): HIV Screen 4th Generation wRfx: NONREACTIVE

## 2022-02-17 LAB — URINE CYTOLOGY ANCILLARY ONLY
Chlamydia: NEGATIVE
Comment: NEGATIVE
Comment: NEGATIVE
Comment: NORMAL
Neisseria Gonorrhea: POSITIVE — AB
Trichomonas: NEGATIVE

## 2022-02-18 ENCOUNTER — Telehealth: Payer: Self-pay | Admitting: Family Medicine

## 2022-02-18 ENCOUNTER — Ambulatory Visit: Payer: PRIVATE HEALTH INSURANCE

## 2022-02-18 ENCOUNTER — Telehealth: Payer: Self-pay

## 2022-02-18 DIAGNOSIS — A549 Gonococcal infection, unspecified: Secondary | ICD-10-CM

## 2022-02-18 MED ORDER — CEFTRIAXONE SODIUM 1 G IJ SOLR: 1.0000 g | Freq: Once | INTRAMUSCULAR | Status: DC

## 2022-02-18 NOTE — Telephone Encounter (Signed)
Called patient to discuss positive gonorrhea test.  Scheduled for nurse visit at 230 today.  1 g Rocephin ordered.

## 2022-02-18 NOTE — Telephone Encounter (Signed)
   Telephone encounter was:  Unsuccessful.  02/18/2022 Name: Jesse Shelton MRN: [id number] DOB: 1981-02-26  Unsuccessful outbound call made today to assist with:  Financial Difficulties related to food, housing, utilities  Outreach Attempt:  1st Attempt  A HIPAA compliant voice message was left requesting a return call.  Instructed patient to call back at [phone].  Alistar Mcenery, AAS Paralegal, [facility] Care Guide  Embedded Care Coordination [facility]  Care Management  [address] [place], [place] [address] ??[email or web address]  ?? [phone]   [email or web address]

## 2022-02-19 ENCOUNTER — Ambulatory Visit (INDEPENDENT_AMBULATORY_CARE_PROVIDER_SITE_OTHER): Payer: PRIVATE HEALTH INSURANCE

## 2022-02-19 DIAGNOSIS — A549 Gonococcal infection, unspecified: Secondary | ICD-10-CM

## 2022-02-19 MED ORDER — CEFTRIAXONE SODIUM 1 G IJ SOLR
1.0000 g | Freq: Once | INTRAMUSCULAR | Status: AC
  Administered 2022-02-19: 1 g via INTRAMUSCULAR

## 2022-02-19 NOTE — Progress Notes (Signed)
Patient in nurse clinic today for STD treatment of Gonorrhea.   Patient advised to abstain from sex for 7-10 days after treatment of self and partner.    Ceftriaxone 1 gram IM x 1 given in RUOQ. Patient observed 15 minutes in office.  No reaction noted.   Patient to follow up in 2-3 months for re-screening.    Provided condoms and advised to use with all sexual activity. Patient verbalized understanding.   STD report form fax completed and faxed to [facility] Department at [phone] (STD department).

## 2022-02-21 ENCOUNTER — Telehealth: Payer: Self-pay

## 2022-02-21 NOTE — Telephone Encounter (Signed)
   Telephone encounter was:  Unsuccessful.  02/21/2022 Name: Jesse Shelton MRN: [id number] DOB: [birth year]  Unsuccessful outbound call made today to assist with:   financial, food, housing, utilities  Outreach Attempt:  2nd Attempt  A HIPAA compliant voice message was left requesting a return call.  Instructed patient to call back at [phone].  Zackaria Burkey, AAS Paralegal, [facility] Care Guide  Embedded Care Coordination [facility]  Care Management  [address] [place], [place] [address] ??[email or web address]  ?? [phone]   [email or web address]

## 2022-02-28 ENCOUNTER — Telehealth: Payer: Self-pay

## 2022-02-28 NOTE — Telephone Encounter (Signed)
   Telephone encounter was:  Unsuccessful.  02/28/2022 Name: Jesse Shelton MRN: [id number] DOB: [birth year]  Unsuccessful outbound call made today to assist with:   food, housing, utilities.  Outreach Attempt:  3rd Attempt.  Referral closed unable to contact patient.  A HIPAA compliant voice message was left requesting a return call.  Instructed patient to call back at [phone].  Reah Justo, AAS Paralegal, [facility] Care Guide  Embedded Care Coordination [facility]  Care Management  [address] [place], [place] [address] ??[email or web address]  ?? [phone]   [email or web address]

## 2022-03-03 ENCOUNTER — Ambulatory Visit (INDEPENDENT_AMBULATORY_CARE_PROVIDER_SITE_OTHER): Payer: No Typology Code available for payment source | Admitting: Psychologist

## 2022-03-03 DIAGNOSIS — F330 Major depressive disorder, recurrent, mild: Secondary | ICD-10-CM

## 2022-03-03 NOTE — Progress Notes (Signed)
[place] Counselor/Therapist Progress Note  Patient ID: Jesse Shelton, MRN: [id number],    Date: 03/03/2022  Time Spent: 8:02 am to 8:40 am; total time: 38 minutes  Treatment Type: Individual Therapy  This session was held via video webex teletherapy due to the coronavirus risk at this time. The patient consented to video teletherapy and was located at his home during this session. He is aware it is the responsibility of the patient to secure confidentiality on his end of the session. The provider was in a private home office for the duration of this session. Limits of confidentiality were discussed with the patient.   Reported Symptoms: Depression related to not connecting social needs met  Mental Status Exam: Appearance:  Casual     Behavior: Appropriate  Motor: Normal  Speech/Language:  Clear and Coherent  Affect: Appropriate  Mood: normal  Thought process: normal  Thought content:   WNL  Sensory/Perceptual disturbances:   WNL  Orientation: oriented to person, place, and time/date  Attention: Good  Concentration: Good  Memory: WNL  Fund of knowledge:  Good  Insight:   Fair  Judgment:  Good  Impulse Control: Good   Risk Assessment: Danger to Self:  No Self-injurious Behavior: No Danger to Others: No Duty to Warn:no Physical Aggression / Violence:No  Access to Firearms a concern: No  Gang Involvement:No   Subjective: Beginning the session, patient described himself as okay while talking about how he is changing to work in the week instead of the on the weekends. Per the patient, he has attempted to socially connect with others, however has been unsuccessful. This has led the patient to being hesitant toward trying again to socially connect with others. Patient identified that he enjoys comi cons, however had a difficult time with the idea of attempting to get social needs met through that option and  getting content at conferences. He spent the session  reflecting on this concept. He denied suicidal and homicidal ideation.    Interventions:  Worked on developing a therapeutic relationship with the patient using active listening and reflective statements. Provided emotional support using empathy and validation. Reflected on events since the last session. Validated some of the frustrations that were expressed by the patient. Validated concerns. Validated worries related to finances. Used socratic questions to assist the patient gain insight into self. Used MI to role with the resistance. Attempted to assist the patient in doing a decisional analysis related to connecting with others. Explored the balance between getting social needs met and getting content. Attempted to assist with problem solving. Followed up regarding seeing another therapist in the community due to the patient wanting to be seen on a more consistent basis than what this clinician can currently provide.  Provided psychoeducation about [facility]. Explored the idea of referring to people in the community who may be better able to meet more consistently with the patient. Assessed for suicidal and homicidal ideation.  Homework: Work on seeing if he can connect with others who are interested in comi con  Next Session: NA. Patient open to being referred out due to wanting to be seen more consistently and patient agreed to the plan.   Diagnosis: F33.0 major depressive affective disorder, recurrent, mild  Plan:   Goals Alleviate depressive symptoms Recognize, accept, and cope with depressive feelings Develop healthy thinking patterns Develop healthy interpersonal relationships  Objectives target date for all objectives is 12/11/2022.  Cooperate with a medication evaluation by  a physician Verbalize an accurate understanding of depression Verbalize an understanding of the treatment Identify and replace thoughts that support  depression Learn and implement behavioral strategies Verbalize an understanding and resolution of current interpersonal problems Learn and implement problem solving and decision making skills Learn and implement conflict resolution skills to resolve interpersonal problems Verbalize an understanding of healthy and unhealthy emotions verbalize insight into how past relationships may be influence current experiences with depression Use mindfulness and acceptance strategies and increase value based behavior  Increase hopeful statements about the future.  Interventions Consistent with treatment model, discuss how change in cognitive, behavioral, and interpersonal can help client alleviate depression CBT Behavioral activation help the client explore the relationship, nature of the dispute,  Help the client develop new interpersonal skills and relationships Conduct Problem so living therapy Teach conflict resolution skills Use a process-experiential approach Conduct TLDP Conduct ACT Evaluate need for psychotropic medication Monitor adherence to medication   The patient and clinician reviewed the treatment plan on 01/01/2022. The patient approved of the treatment plan.   Conception Chancy, PsyD

## 2022-03-04 ENCOUNTER — Encounter: Payer: Self-pay | Admitting: *Deleted

## 2022-03-21 ENCOUNTER — Ambulatory Visit (INDEPENDENT_AMBULATORY_CARE_PROVIDER_SITE_OTHER): Payer: PRIVATE HEALTH INSURANCE | Admitting: Family Medicine

## 2022-03-21 ENCOUNTER — Other Ambulatory Visit (HOSPITAL_COMMUNITY)
Admission: RE | Admit: 2022-03-21 | Discharge: 2022-03-21 | Disposition: A | Discharge status: Home / Self Care | Payer: PRIVATE HEALTH INSURANCE | Source: Ambulatory Visit | Attending: Family Medicine | Admitting: Family Medicine

## 2022-03-21 VITALS — BP 145/91 | HR 109 | Ht 69.0 in | Wt 229.5 lb

## 2022-03-21 DIAGNOSIS — Z113 Encounter for screening for infections with a predominantly sexual mode of transmission: Secondary | ICD-10-CM

## 2022-03-21 DIAGNOSIS — Z114 Encounter for screening for human immunodeficiency virus [HIV]: Secondary | ICD-10-CM

## 2022-03-22 LAB — RPR: RPR Ser Ql: NONREACTIVE

## 2022-03-22 LAB — HIV ANTIBODY (ROUTINE TESTING W REFLEX): HIV Screen 4th Generation wRfx: NONREACTIVE

## 2022-03-24 LAB — URINE CYTOLOGY ANCILLARY ONLY
Chlamydia: NEGATIVE
Comment: NEGATIVE
Comment: NEGATIVE
Comment: NORMAL
Neisseria Gonorrhea: NEGATIVE
Trichomonas: NEGATIVE

## 2022-03-25 ENCOUNTER — Encounter (HOSPITAL_COMMUNITY): Payer: Self-pay | Admitting: Family Medicine

## 2022-04-03 ENCOUNTER — Other Ambulatory Visit: Payer: Self-pay | Admitting: Family Medicine

## 2022-05-01 ENCOUNTER — Ambulatory Visit (HOSPITAL_COMMUNITY): Payer: PRIVATE HEALTH INSURANCE | Admitting: Psychiatry

## 2022-07-22 ENCOUNTER — Ambulatory Visit: Payer: PRIVATE HEALTH INSURANCE | Admitting: Student

## 2022-10-31 ENCOUNTER — Other Ambulatory Visit (HOSPITAL_COMMUNITY): Payer: Self-pay

## 2022-10-31 ENCOUNTER — Telehealth: Payer: Self-pay

## 2022-10-31 NOTE — Telephone Encounter (Signed)
A Prior Authorization was initiated for this patients TRULICITY through CoverMyMeds.   Key: Jesse Shelton

## 2022-11-19 ENCOUNTER — Ambulatory Visit (INDEPENDENT_AMBULATORY_CARE_PROVIDER_SITE_OTHER): Payer: PRIVATE HEALTH INSURANCE | Admitting: Student

## 2022-11-19 ENCOUNTER — Encounter: Payer: Self-pay | Admitting: Student

## 2022-11-19 ENCOUNTER — Other Ambulatory Visit (HOSPITAL_COMMUNITY)
Admission: RE | Admit: 2022-11-19 | Discharge: 2022-11-19 | Disposition: A | Discharge status: Home / Self Care | Payer: PRIVATE HEALTH INSURANCE | Source: Ambulatory Visit | Attending: Family Medicine | Admitting: Family Medicine

## 2022-11-19 VITALS — BP 137/103 | HR 103 | Ht 69.0 in | Wt 226.0 lb

## 2022-11-19 DIAGNOSIS — Z113 Encounter for screening for infections with a predominantly sexual mode of transmission: Secondary | ICD-10-CM | POA: Insufficient documentation

## 2022-11-19 DIAGNOSIS — B009 Herpesviral infection, unspecified: Secondary | ICD-10-CM

## 2022-11-19 DIAGNOSIS — N529 Male erectile dysfunction, unspecified: Secondary | ICD-10-CM

## 2022-11-19 DIAGNOSIS — Z23 Encounter for immunization: Secondary | ICD-10-CM | POA: Diagnosis not present

## 2022-11-19 DIAGNOSIS — E119 Type 2 diabetes mellitus without complications: Secondary | ICD-10-CM | POA: Diagnosis not present

## 2022-11-19 DIAGNOSIS — F330 Major depressive disorder, recurrent, mild: Secondary | ICD-10-CM | POA: Diagnosis not present

## 2022-11-19 LAB — POCT GLYCOSYLATED HEMOGLOBIN (HGB A1C): HbA1c, POC (controlled diabetic range): 6.4 % (ref 0.0–7.0)

## 2022-11-19 MED ORDER — MIRTAZAPINE 45 MG PO TABS: 45.0000 mg | ORAL_TABLET | Freq: Every day | ORAL | 2 refills | Status: AC

## 2022-11-19 MED ORDER — VALACYCLOVIR HCL 1 G PO TABS: 1000.0000 mg | ORAL_TABLET | Freq: Every day | ORAL | 3 refills | Status: DC

## 2022-11-19 MED ORDER — SILDENAFIL CITRATE 100 MG PO TABS: ORAL_TABLET | ORAL | 2 refills | Status: DC

## 2022-11-19 MED ORDER — BUPROPION HCL ER (XL) 300 MG PO TB24: 300.0000 mg | ORAL_TABLET | Freq: Every day | ORAL | 3 refills | Status: DC

## 2022-11-19 MED ORDER — TRULICITY 0.75 MG/0.5ML ~~LOC~~ SOAJ: 0.7500 mg | SUBCUTANEOUS | 3 refills | Status: DC

## 2022-11-19 NOTE — Progress Notes (Signed)
Annual Wellness Visit     Patient: Jesse Shelton, Male    DOB: 12/22/1980, 42 y.o.   MRN: [id number]  Subjective  Chief Complaint  Patient presents with   Annual Exam    Jesse Shelton is a 42 y.o. male who presents today for his Annual Wellness Visit. He reports consuming a general diet. The patient does not participate in regular exercise at present. He generally feels well. He reports sleeping well. He does not have additional problems to discuss today.  History of diabetes on Trulicity weekly.  Endorses compliance to medication and A1c today 6.4.  No reports of hypoglycemia or hypoglycemia.  He is due for his annual diabetes eye exam.  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   [DISCONTINUED] mirtazapine (REMERON) 45 MG tablet Take by mouth.   [DISCONTINUED] sildenafil (VIAGRA) 100 MG tablet TAKE 1 TABLET 1 HOUR BEFORE INTERCOURSE DAILY AS NEEDED   [DISCONTINUED] TRULICITY A999333 0000000 SOPN Inject 0.75 mg into the skin once a week.   [DISCONTINUED] valACYclovir (VALTREX) 1000 MG tablet Take 1 tablet (1,000 mg total) by mouth daily for 5 days within 1 day of lesion as needed.   Facility-Administered Medications Prior to Visit  Medication Dose Route Frequency Provider   cefTRIAXone (ROCEPHIN) injection 1 g  1 g Intramuscular Once Cresenzo, Angelyn Punt, MD    No Active Allergies   Objective  BP (!) 137/103   Pulse (!) 103   Ht 5' 9"$  (1.753 m)   Wt 226 lb (102.5 kg)   SpO2 100%   BMI 33.37 kg/m   Physical Exam General: Alert, well appearing, NAD HEENT: Atraumatic, MMM, No sclera icterus CV: RRR, no murmurs,  normal S1/S2 Pulm: CTAB, good WOB on RA, no crackles or wheezing Abd: Soft, no distension, no tenderness Skin: dry, warm Ext: No BLE edema, +2 Pedal and radial pulse.     Most recent depression screenings:    11/19/2022    9:36 AM 03/21/2022    8:36 AM  PHQ 2/9 Scores  PHQ - 2 Score 2 2  PHQ- 9 Score 5 3    Results for orders placed or performed in visit on 11/19/22  HgB A1c  Result Value Ref Range   Hemoglobin A1C     HbA1c POC (<> result, manual entry)     HbA1c, POC (prediabetic range)     HbA1c, POC (controlled diabetic range) 6.4 0.0 - 7.0 %      Assessment & Plan   Overall healthy 42 year old male.  No medical concerns today.  Type 2 diabetes Diabetes well-controlled.  A1c today was 6.4.  Endorses compliance to Trulicity. -Refilled his Trulicity. -Obtain labs for microalbuminuria, CMP and lipid panel  Elevated blood pressure His blood pressure today was 141/100 and on repeat was 134/103.  Reported this is likely because he is in the clinic and has been running around.  He reports previous blood pressures have been normal.  Per chart review patient has had elevated blood pressures in the past.  Advised patient to check blood pressure readings at home and if elevated he will most likely need to start on hypertensive  medications. -Could benefit on starting hypertensive medications   Annual wellness visit done today including the all of the following: Reviewed patient's Family Medical History Reviewed and updated list of patient's medical providers Assessment of cognitive impairment was done Assessed patient's functional ability Established a written schedule for health screening Stout Completed and Reviewed  Discussed health benefits of physical activity, and encouraged him to engage in regular exercise appropriate for his age and condition.    Problem List Items Addressed This Visit       Endocrine   Type 2 diabetes mellitus without  complications (HCC)   Relevant Medications   Dulaglutide (TRULICITY) A999333 0000000 SOPN   Other Relevant Orders   HgB A1c (Completed)   Microalbumin / creatinine urine ratio   CBC with Differential   Lipid Panel   Comprehensive metabolic panel     Other   ED (erectile dysfunction) of organic origin   Relevant Medications   sildenafil (VIAGRA) 100 MG tablet   Mild episode of recurrent major depressive disorder (HCC)   Relevant Medications   buPROPion (WELLBUTRIN XL) 300 MG 24 hr tablet   mirtazapine (REMERON) 45 MG tablet   Other Visit Diagnoses     Screen for STD (sexually transmitted disease)    -  Primary   Relevant Orders   HIV antibody (with reflex)   RPR   Urine cytology ancillary only   Herpes       Relevant Medications   valACYclovir (VALTREX) 1000 MG tablet       Alen Bleacher, MD

## 2022-11-19 NOTE — Patient Instructions (Addendum)
It was wonderful to meet you today. Thank you for allowing me to be a part of your care. Below is a short summary of what we discussed at your visit today:  Pressure today is elevated.  I recommend that you check your blood pressures at home daily and if he continues to remain elevated please come back so we can start you on a hypertensive medication.  Please follow-up with your psychiatry on refilling your Adderall.  I have refilled your other medications.  Your A1c today was 6.4.  We are collecting labs today to check for STD and kidney function    Please bring all of your medications to every appointment!  If you have any questions or concerns, please do not hesitate to contact us via phone or MyChart message.   Alen Bleacher, MD [facility]

## 2022-11-20 LAB — COMPREHENSIVE METABOLIC PANEL
ALT: 20 IU/L (ref 0–44)
AST: 13 IU/L (ref 0–40)
Albumin/Globulin Ratio: 1.4 (ref 1.2–2.2)
Albumin: 4.2 g/dL (ref 4.1–5.1)
Alkaline Phosphatase: 100 IU/L (ref 44–121)
BUN/Creatinine Ratio: 12 (ref 9–20)
BUN: 13 mg/dL (ref 6–24)
Bilirubin Total: <0.2 mg/dL (ref 0.0–1.2)
CO2: 22 mmol/L (ref 20–29)
Calcium: 9.8 mg/dL (ref 8.7–10.2)
Chloride: 108 mmol/L — ABNORMAL HIGH (ref 96–106)
Creatinine, Ser: 1.13 mg/dL (ref 0.76–1.27)
Globulin, Total: 3 g/dL (ref 1.5–4.5)
Glucose: 106 mg/dL — ABNORMAL HIGH (ref 70–99)
Potassium: 4.4 mmol/L (ref 3.5–5.2)
Sodium: 144 mmol/L (ref 134–144)
Total Protein: 7.2 g/dL (ref 6.0–8.5)
eGFR: 84 mL/min/{1.73_m2} (ref >59)

## 2022-11-20 LAB — CBC WITH DIFFERENTIAL/PLATELET
Basophils Absolute: 0.1 10*3/uL (ref 0.0–0.2)
Basos: 1 %
EOS (ABSOLUTE): 0.2 10*3/uL (ref 0.0–0.4)
Eos: 3 %
Hematocrit: 47.6 % (ref 37.5–51.0)
Hemoglobin: 16.2 g/dL (ref 13.0–17.7)
Immature Grans (Abs): 0 10*3/uL (ref 0.0–0.1)
Immature Granulocytes: 0 %
Lymphocytes Absolute: 3.1 10*3/uL (ref 0.7–3.1)
Lymphs: 42 %
MCH: 32 pg (ref 26.6–33.0)
MCHC: 34 g/dL (ref 31.5–35.7)
MCV: 94 fL (ref 79–97)
Monocytes Absolute: 0.7 10*3/uL (ref 0.1–0.9)
Monocytes: 9 %
Neutrophils Absolute: 3.2 10*3/uL (ref 1.4–7.0)
Neutrophils: 45 %
Platelets: 400 10*3/uL (ref 150–450)
RBC: 5.07 x10E6/uL (ref 4.14–5.80)
RDW: 12.8 % (ref 11.6–15.4)
WBC: 7.3 10*3/uL (ref 3.4–10.8)

## 2022-11-20 LAB — LIPID PANEL
Chol/HDL Ratio: 4 ratio (ref 0.0–5.0)
Cholesterol, Total: 166 mg/dL (ref 100–199)
HDL: 42 mg/dL (ref >39)
LDL Chol Calc (NIH): 101 mg/dL — ABNORMAL HIGH (ref 0–99)
Triglycerides: 131 mg/dL (ref 0–149)
VLDL Cholesterol Cal: 23 mg/dL (ref 5–40)

## 2022-11-20 LAB — MICROALBUMIN / CREATININE URINE RATIO
Creatinine, Urine: 120.3 mg/dL
Microalb/Creat Ratio: <2 mg/g creat (ref 0–29)
Microalbumin, Urine: <3 ug/mL

## 2022-11-20 LAB — HIV ANTIBODY (ROUTINE TESTING W REFLEX): HIV Screen 4th Generation wRfx: NONREACTIVE

## 2022-11-20 LAB — RPR: RPR Ser Ql: NONREACTIVE

## 2022-11-21 ENCOUNTER — Other Ambulatory Visit: Payer: Self-pay | Admitting: Student

## 2022-11-21 LAB — URINE CYTOLOGY ANCILLARY ONLY
Chlamydia: POSITIVE — AB
Comment: NEGATIVE
Comment: NORMAL
Neisseria Gonorrhea: NEGATIVE

## 2022-11-21 NOTE — Progress Notes (Signed)
Called patient to discuss recent lab findings.  Unable to reach patient and left a generic voice message.

## 2022-11-22 ENCOUNTER — Ambulatory Visit
Admission: EM | Admit: 2022-11-22 | Discharge: 2022-11-22 | Disposition: A | Discharge status: Home / Self Care | Payer: PRIVATE HEALTH INSURANCE

## 2022-11-22 DIAGNOSIS — A749 Chlamydial infection, unspecified: Secondary | ICD-10-CM | POA: Diagnosis not present

## 2022-11-22 MED ORDER — DOXYCYCLINE HYCLATE 100 MG PO CAPS: 100.0000 mg | ORAL_CAPSULE | Freq: Two times a day (BID) | ORAL | 0 refills | Status: AC

## 2022-11-22 NOTE — ED Provider Notes (Signed)
[facility]    CSN: [id number] Arrival date & time: 11/22/22  1424      History   Chief Complaint Chief Complaint  Patient presents with   TREATMENT    HPI Jesse Shelton is a 42 y.o. male is for chlamydia treatment.  Patient was seen by his PCP on 2/21 and had routine screening.  He did test positive for chlamydia but as it was the weekend he has not been able to get treatment.  He denies any current symptoms including penile discharge, testicular pain or swelling, fevers or chills or dysuria.  He states has been treated for this several years ago.  No other concerns at this time.  HPI  Past Medical History:  Diagnosis Date   Anxiety    Cholelithiasis    Depression    Herpes    Insomnia    Sleep apnea     Patient Active Problem List   Diagnosis Date Noted   Routine screening for STI (sexually transmitted infection) 02/14/2022   Annual physical exam 11/18/2021   Mild episode of recurrent major depressive disorder ([place]) 11/17/2020   Vitamin D deficiency 11/17/2020   Hyperlipidemia associated with type 2 diabetes mellitus ([place]) 11/18/2019   Tobacco use 11/18/2019   Type 2 diabetes mellitus without complications ([place]) [id number]   GAD (generalized anxiety disorder) 11/10/2018   High risk heterosexual behavior 11/10/2017   OSA (obstructive sleep apnea) 02/14/2015   Genital herpes 10/18/2014   ED (erectile dysfunction) of organic origin 10/18/2014    Past Surgical History:  Procedure Laterality Date   BONE MARROW HARVEST  09/2011   CHOLECYSTECTOMY N/A 08/19/2018   Procedure: LAPAROSCOPIC CHOLECYSTECTOMY;  Surgeon: Johnathan Hausen, MD;  Location: [facility];  Service: General;  Laterality: N/A;   [place]   RIGHT   HERNIA REPAIR N/A 2002   [place]       Home Medications    Prior to Admission medications   Medication Sig Start Date End Date Taking? Authorizing Provider  doxycycline (VIBRAMYCIN) 100 MG capsule Take 1 capsule (100 mg total)  by mouth 2 (two) times daily for 7 days. 11/22/22 11/29/22 Yes Melynda Ripple, NP  amphetamine-dextroamphetamine (ADDERALL XR) 25 MG 24 hr capsule Take by mouth every morning.    [provider]  buPROPion (WELLBUTRIN XL) 300 MG 24 hr tablet Take 1 tablet (300 mg total) by mouth daily. 11/19/22   Alen Bleacher, MD  Dulaglutide (TRULICITY) A999333 0000000 SOPN Inject 0.75 mg into the skin once a week. 11/19/22   Alen Bleacher, MD  mirtazapine (REMERON) 45 MG tablet Take 1 tablet (45 mg total) by mouth at bedtime. 11/19/22 02/17/23  Alen Bleacher, MD  sildenafil (VIAGRA) 100 MG tablet TAKE 1 TABLET 1 HOUR BEFORE INTERCOURSE DAILY AS NEEDED 11/19/22   Alen Bleacher, MD  valACYclovir (VALTREX) 1000 MG tablet Take 1 tablet (1,000 mg total) by mouth daily for 5 days within 1 day of lesion as needed. 11/19/22   Alen Bleacher, MD  metFORMIN (GLUCOPHAGE XR) 750 MG 24 hr tablet Take 2 tablets (1,500 mg total) by mouth daily with breakfast. 02/25/19 05/12/19  Harriet Butte, DO    Family History Family History  Problem Relation Age of Onset   Cancer Maternal Grandmother        pancreatic cancer   Depression Maternal Grandmother    Diabetes Maternal Grandmother    Depression Maternal Aunt    Diabetes Maternal Aunt     Social History Social History  Tobacco Use   Smoking status: Every Day    Packs/day: 0.10    Years: 5.00    Total pack years: 0.50    Types: E-cigarettes, Cigarettes   Smokeless tobacco: Never   Tobacco comments:     vapes  Vaping Use   Vaping Use: Every day  Substance Use Topics   Alcohol use: Yes    Alcohol/week: 2.0 standard drinks of alcohol    Types: 1 Cans of beer, 1 Shots of liquor per week   Drug use: No     Allergies   Patient has no active allergies.   Review of Systems Review of Systems  Genitourinary:        Positive chlamydia test     Physical Exam Triage Vital Signs ED Triage Vitals  Enc Vitals Group     BP 11/22/22 1514 135/88     Pulse Rate  11/22/22 1514 94     Resp 11/22/22 1514 18     Temp 11/22/22 1514 98.2 F (36.8 C)     Temp Source 11/22/22 1514 Oral     SpO2 11/22/22 1514 98 %     Weight --      Height --      Head Circumference --      Peak Flow --      Pain Score 11/22/22 1520 0     Pain Loc --      Pain Edu? --      Excl. in [place]? --    No data found.  Updated Vital Signs BP 135/88 (BP Location: Left Arm)   Pulse 94   Temp 98.2 F (36.8 C) (Oral)   Resp 18   SpO2 98%   Visual Acuity Right Eye Distance:   Left Eye Distance:   Bilateral Distance:    Right Eye Near:   Left Eye Near:    Bilateral Near:     Physical Exam Vitals and nursing note reviewed.  Constitutional:      Appearance: Normal appearance.  HENT:     Head: Normocephalic and atraumatic.  Eyes:     Pupils: Pupils are equal, round, and reactive to light.  Cardiovascular:     Rate and Rhythm: Normal rate.  Pulmonary:     Effort: Pulmonary effort is normal.  Abdominal:     Tenderness: There is no right CVA tenderness or left CVA tenderness.  Skin:    General: Skin is warm and dry.  Neurological:     General: No focal deficit present.     Mental Status: He is alert and oriented to person, place, and time.  Psychiatric:        Mood and Affect: Mood normal.        Behavior: Behavior normal.      UC Treatments / Results  Labs (all labs ordered are listed, but only abnormal results are displayed) Labs Reviewed - No data to display  EKG   Radiology No results found.  Procedures Procedures (including critical care time)  Medications Ordered in UC Medications - No data to display  Initial Impression / Assessment and Plan / UC Course  I have reviewed the triage vital signs and the nursing notes.  Pertinent labs & imaging results that were available during my care of the patient were reviewed by me and considered in my medical decision making (see chart for details).     Positive chlamydia test viewed in  epic Start doxycycline twice daily for 7 days Follow-up with PCP  as needed Final Clinical Impressions(s) / UC Diagnoses   Final diagnoses:  Positive Chlamydia PCR     Discharge Instructions      Doxycycline twice daily for 7 days Follow-up with your PCP as needed   ED Prescriptions     Medication Sig Dispense Auth. Provider   doxycycline (VIBRAMYCIN) 100 MG capsule Take 1 capsule (100 mg total) by mouth 2 (two) times daily for 7 days. 14 capsule Melynda Ripple, NP      PDMP not reviewed this encounter.   Melynda Ripple, NP 11/22/22 [phone]

## 2022-11-22 NOTE — ED Triage Notes (Signed)
Patient presents to urgent care for treatment of chlamydia. Patient tested positive 3 days ago.

## 2022-11-22 NOTE — Discharge Instructions (Signed)
Doxycycline twice daily for 7 days Follow-up with your PCP as needed

## 2022-11-24 ENCOUNTER — Telehealth: Payer: Self-pay

## 2022-11-24 NOTE — Telephone Encounter (Signed)
STI reporting form sent to [facility].  Ottis Stain, CMA

## 2022-11-24 NOTE — Telephone Encounter (Signed)
-----   Message from [place] Pink, [place] sent at 11/24/2022 10:19 AM EST ----- Regarding: STI reporting Positive chlamydia

## 2022-11-27 ENCOUNTER — Other Ambulatory Visit: Payer: Self-pay | Admitting: Student

## 2023-01-18 ENCOUNTER — Encounter (HOSPITAL_COMMUNITY): Payer: Self-pay

## 2023-01-18 ENCOUNTER — Emergency Department (HOSPITAL_COMMUNITY)
Admission: EM | Admit: 2023-01-18 | Discharge: 2023-01-18 | Disposition: A | Discharge status: Home / Self Care | Payer: PRIVATE HEALTH INSURANCE | Attending: Emergency Medicine | Admitting: Emergency Medicine

## 2023-01-18 DIAGNOSIS — R7989 Other specified abnormal findings of blood chemistry: Secondary | ICD-10-CM | POA: Insufficient documentation

## 2023-01-18 DIAGNOSIS — R197 Diarrhea, unspecified: Secondary | ICD-10-CM

## 2023-01-18 LAB — I-STAT CHEM 8, ED
BUN: 16 mg/dL (ref 6–20)
Calcium, Ion: 0.98 mmol/L — ABNORMAL LOW (ref 1.15–1.40)
Chloride: 113 mmol/L — ABNORMAL HIGH (ref 98–111)
Creatinine, Ser: 1 mg/dL (ref 0.61–1.24)
Glucose, Bld: 112 mg/dL — ABNORMAL HIGH (ref 70–99)
HCT: 45 % (ref 39.0–52.0)
Hemoglobin: 15.3 g/dL (ref 13.0–17.0)
Potassium: 3.8 mmol/L (ref 3.5–5.1)
Sodium: 141 mmol/L (ref 135–145)
TCO2: 19 mmol/L — ABNORMAL LOW (ref 22–32)

## 2023-01-18 LAB — CBC WITH DIFFERENTIAL/PLATELET
Abs Immature Granulocytes: 0.05 10*3/uL (ref 0.00–0.07)
Basophils Absolute: 0 10*3/uL (ref 0.0–0.1)
Basophils Relative: 0 %
Eosinophils Absolute: 0.2 10*3/uL (ref 0.0–0.5)
Eosinophils Relative: 2 %
HCT: 50.7 % (ref 39.0–52.0)
Hemoglobin: 17.3 g/dL — ABNORMAL HIGH (ref 13.0–17.0)
Immature Granulocytes: 1 %
Lymphocytes Relative: 38 %
Lymphs Abs: 3.6 10*3/uL (ref 0.7–4.0)
MCH: 31.7 pg (ref 26.0–34.0)
MCHC: 34.1 g/dL (ref 30.0–36.0)
MCV: 93 fL (ref 80.0–100.0)
Monocytes Absolute: 1.3 10*3/uL — ABNORMAL HIGH (ref 0.1–1.0)
Monocytes Relative: 14 %
Neutro Abs: 4.3 10*3/uL (ref 1.7–7.7)
Neutrophils Relative %: 45 %
Platelets: 395 10*3/uL (ref 150–400)
RBC: 5.45 MIL/uL (ref 4.22–5.81)
RDW: 13.6 % (ref 11.5–15.5)
WBC: 9.5 10*3/uL (ref 4.0–10.5)
nRBC: 0 % (ref 0.0–0.2)

## 2023-01-18 LAB — COMPREHENSIVE METABOLIC PANEL
ALT: 31 U/L (ref 0–44)
AST: 21 U/L (ref 15–41)
Albumin: 3.8 g/dL (ref 3.5–5.0)
Alkaline Phosphatase: 77 U/L (ref 38–126)
Anion gap: 10 (ref 5–15)
BUN: 16 mg/dL (ref 6–20)
CO2: 18 mmol/L — ABNORMAL LOW (ref 22–32)
Calcium: 8.9 mg/dL (ref 8.9–10.3)
Chloride: 109 mmol/L (ref 98–111)
Creatinine, Ser: 1.25 mg/dL — ABNORMAL HIGH (ref 0.61–1.24)
GFR, Estimated: >60 mL/min (ref >60)
Glucose, Bld: 130 mg/dL — ABNORMAL HIGH (ref 70–99)
Potassium: 3.7 mmol/L (ref 3.5–5.1)
Sodium: 137 mmol/L (ref 135–145)
Total Bilirubin: 0.4 mg/dL (ref 0.3–1.2)
Total Protein: 7.7 g/dL (ref 6.5–8.1)

## 2023-01-18 LAB — C DIFFICILE QUICK SCREEN W PCR REFLEX
C Diff antigen: NEGATIVE
C Diff interpretation: NOT DETECTED
C Diff toxin: NEGATIVE

## 2023-01-18 MED ORDER — LOPERAMIDE HCL 2 MG PO CAPS
4.0000 mg | ORAL_CAPSULE | Freq: Once | ORAL | Status: AC
  Administered 2023-01-18: 4 mg via ORAL
  Filled 2023-01-18: qty 2

## 2023-01-18 MED ORDER — SODIUM CHLORIDE 0.9 % IV BOLUS
1000.0000 mL | Freq: Once | INTRAVENOUS | Status: AC
  Administered 2023-01-18: 1000 mL via INTRAVENOUS

## 2023-01-18 NOTE — ED Provider Notes (Signed)
[place] EMERGENCY DEPARTMENT AT [facility] Provider Note   CSN: [id number] Arrival date & time: 01/18/23  1252     History  Chief Complaint  Patient presents with   Abdominal Pain    JAYKO VOORHEES is a 43 y.o. male.  Patient is a 42 year old male who presents with diarrhea.  He states that he has had diarrhea for the last 2 days.  This is his third day.  He has had multiple episodes of watery, nonbloody diarrhea during the day.  He had some nausea and vomiting initially but no ongoing nausea or vomiting.  He has some lower abdominal cramping prior to diarrhea but no ongoing abdominal pain.  No known fevers.  No recent antibiotic use.  No camping or recent travel history.  No known sick contacts.  He is taken some Pepto-Bismol but no other medications for the diarrhea.  He did bring in a refrigerated stool sample available for testing.       Home Medications Prior to Admission medications   Medication Sig Start Date End Date Taking? Authorizing Provider  amphetamine-dextroamphetamine (ADDERALL XR) 25 MG 24 hr capsule Take by mouth every morning.    [provider]  buPROPion (WELLBUTRIN XL) 300 MG 24 hr tablet Take 1 tablet (300 mg total) by mouth daily. 11/19/22   Jerre Simon, MD  Dulaglutide (TRULICITY) 0.75 MG/0.5ML SOPN Inject 0.75 mg into the skin once a week. 11/19/22   Jerre Simon, MD  mirtazapine (REMERON) 45 MG tablet Take 1 tablet (45 mg total) by mouth at bedtime. 11/19/22 02/17/23  Jerre Simon, MD  sildenafil (VIAGRA) 100 MG tablet TAKE 1 TABLET 1 HOUR BEFORE INTERCOURSE DAILY AS NEEDED 11/19/22   Jerre Simon, MD  valACYclovir (VALTREX) 1000 MG tablet Take 1 tablet (1,000 mg total) by mouth daily for 5 days within 1 day of lesion as needed. 11/19/22   Jerre Simon, MD  metFORMIN (GLUCOPHAGE XR) 750 MG 24 hr tablet Take 2 tablets (1,500 mg total) by mouth daily with breakfast. 02/25/19 05/12/19  Durward Parcel, DO      Allergies    Patient  has no known allergies.    Review of Systems   Review of Systems  Constitutional:  Positive for fatigue. Negative for chills, diaphoresis and fever.  HENT:  Negative for congestion, rhinorrhea and sneezing.   Eyes: Negative.   Respiratory:  Negative for cough, chest tightness and shortness of breath.   Cardiovascular:  Negative for chest pain and leg swelling.  Gastrointestinal:  Positive for abdominal pain, diarrhea, nausea and vomiting. Negative for blood in stool.  Genitourinary:  Negative for difficulty urinating, flank pain, frequency and hematuria.  Musculoskeletal:  Negative for arthralgias and back pain.  Skin:  Negative for rash.  Neurological:  Negative for dizziness, speech difficulty, weakness, numbness and headaches.    Physical Exam Updated Vital Signs BP (!) 140/104 (BP Location: Left Arm)   Pulse 100   Temp 98.4 F (36.9 C) (Oral)   Resp 18   SpO2 100%  Physical Exam Constitutional:      Appearance: He is well-developed.  HENT:     Head: Normocephalic and atraumatic.  Eyes:     Pupils: Pupils are equal, round, and reactive to light.  Cardiovascular:     Rate and Rhythm: Normal rate and regular rhythm.     Heart sounds: Normal heart sounds.  Pulmonary:     Effort: Pulmonary effort is normal. No respiratory distress.     Breath sounds:  Normal breath sounds. No wheezing or rales.  Chest:     Chest wall: No tenderness.  Abdominal:     General: Bowel sounds are normal.     Palpations: Abdomen is soft.     Tenderness: There is no abdominal tenderness. There is no guarding or rebound.  Musculoskeletal:        General: Normal range of motion.     Cervical back: Normal range of motion and neck supple.  Lymphadenopathy:     Cervical: No cervical adenopathy.  Skin:    General: Skin is warm and dry.     Findings: No rash.  Neurological:     Mental Status: He is alert and oriented to person, place, and time.     ED Results / Procedures / Treatments    Labs (all labs ordered are listed, but only abnormal results are displayed) Labs Reviewed  COMPREHENSIVE METABOLIC PANEL - Abnormal; Notable for the following components:      Result Value   CO2 18 (*)    Glucose, Bld 130 (*)    Creatinine, Ser 1.25 (*)    All other components within normal limits  CBC WITH DIFFERENTIAL/PLATELET - Abnormal; Notable for the following components:   Hemoglobin 17.3 (*)    Monocytes Absolute 1.3 (*)    All other components within normal limits  I-STAT CHEM 8, ED - Abnormal; Notable for the following components:   Chloride 113 (*)    Glucose, Bld 112 (*)    Calcium, Ion 0.98 (*)    TCO2 19 (*)    All other components within normal limits  C DIFFICILE QUICK SCREEN W PCR REFLEX    GASTROINTESTINAL PANEL BY PCR, STOOL (REPLACES STOOL CULTURE)    EKG None  Radiology No results found.  Procedures Procedures    Medications Ordered in ED Medications  sodium chloride 0.9 % bolus 1,000 mL (1,000 mLs Intravenous New Bag/Given 01/18/23 1332)  loperamide (IMODIUM) capsule 4 mg (4 mg Oral Given 01/18/23 1332)    ED Course/ Medical Decision Making/ A&P                             Medical Decision Making Amount and/or Complexity of Data Reviewed Labs: ordered.  Risk Prescription drug management.   Patient is a 42 year old who presents with diarrhea for the last 2 days.  His abdominal exam is benign.  Labs were obtained which show mild elevation in his serum creatinine which is likely due to dehydration but otherwise his labs are nonconcerning.  He was given IV fluids and a dose of Imodium.  His C. difficile testing was negative.  GI panel is pending.  Overall, most likely to be a viral type illness.  At this point there is no indication for inpatient treatment.  Given his abdominal exam, I do not feel that imaging is indicated.  He is overall well-appearing and feels ready for discharge.  He was discharged home in good condition.  Symptomatic care  instructions were given.  He was advised to follow-up with his primary care doctor to have his creatinine rechecked.  I had compared to prior values on a chart review and states minimally elevated compared to prior values.  I did also discuss following up with the GI doctor of his symptoms are improving.  He was given information about this.  Return precautions were given.  Final Clinical Impression(s) / ED Diagnoses Final diagnoses:  Diarrhea, unspecified type  Elevated serum creatinine    Rx / DC Orders ED Discharge Orders     None         Rolan Bucco, MD 01/18/23 [phone]

## 2023-01-18 NOTE — ED Triage Notes (Signed)
Pt arrived via POV, c/o lower abd pain and diarrhea. Denies any n/v or dysuria.

## 2023-01-18 NOTE — Discharge Instructions (Signed)
Follow-up with your primary care doctor to have your creatinine rechecked.  I have given you information about following up with a gastroenterologist if your symptoms continue.  Return to the emergency room if you have any worsening symptoms such as worsening diarrhea, lightheadedness, abdominal pain, fevers or other worsening symptoms.

## 2023-01-19 LAB — GASTROINTESTINAL PANEL BY PCR, STOOL (REPLACES STOOL CULTURE)

## 2023-03-06 ENCOUNTER — Other Ambulatory Visit (HOSPITAL_COMMUNITY)
Admission: RE | Admit: 2023-03-06 | Discharge: 2023-03-06 | Disposition: A | Discharge status: Home / Self Care | Payer: PRIVATE HEALTH INSURANCE | Source: Ambulatory Visit | Attending: Family Medicine | Admitting: Family Medicine

## 2023-03-06 ENCOUNTER — Ambulatory Visit: Payer: PRIVATE HEALTH INSURANCE | Admitting: Family Medicine

## 2023-03-06 ENCOUNTER — Encounter: Payer: Self-pay | Admitting: Family Medicine

## 2023-03-06 ENCOUNTER — Other Ambulatory Visit: Payer: Self-pay

## 2023-03-06 ENCOUNTER — Ambulatory Visit (INDEPENDENT_AMBULATORY_CARE_PROVIDER_SITE_OTHER): Payer: PRIVATE HEALTH INSURANCE | Admitting: Family Medicine

## 2023-03-06 VITALS — BP 134/88 | HR 92 | Ht 69.0 in | Wt 235.6 lb

## 2023-03-06 DIAGNOSIS — E1169 Type 2 diabetes mellitus with other specified complication: Secondary | ICD-10-CM

## 2023-03-06 DIAGNOSIS — Z8619 Personal history of other infectious and parasitic diseases: Secondary | ICD-10-CM | POA: Diagnosis not present

## 2023-03-06 DIAGNOSIS — Z113 Encounter for screening for infections with a predominantly sexual mode of transmission: Secondary | ICD-10-CM | POA: Diagnosis present

## 2023-03-06 DIAGNOSIS — E119 Type 2 diabetes mellitus without complications: Secondary | ICD-10-CM

## 2023-03-06 DIAGNOSIS — E785 Hyperlipidemia, unspecified: Secondary | ICD-10-CM

## 2023-03-06 LAB — POCT GLYCOSYLATED HEMOGLOBIN (HGB A1C): HbA1c, POC (controlled diabetic range): 6.4 % (ref 0.0–7.0)

## 2023-03-06 MED ORDER — ATORVASTATIN CALCIUM 20 MG PO TABS: 20.0000 mg | ORAL_TABLET | Freq: Every day | ORAL | 3 refills | Status: DC

## 2023-03-06 MED ORDER — TRULICITY 1.5 MG/0.5ML ~~LOC~~ SOAJ: 1.5000 mg | SUBCUTANEOUS | 2 refills | Status: DC

## 2023-03-06 NOTE — Progress Notes (Signed)
    SUBJECTIVE:   CHIEF COMPLAINT / HPI:  Chief Complaint  Patient presents with   std check   Diabetes    Patient tested positive for chlamydia on 11/19/2022, treated with doxycycline. [facility] Patient would like STI testing.  Denies any symptoms including penile discharge, fever, chills.  He has been sexually active but denies any new partners.  Patient is doing well with his Trulicity 0.75 mg weekly. He believes that he has plateaued as far as weight loss and his A1c numbers.  PERTINENT  PMH / PSH: T2DM (last A1c 6.4 on 11/19/2022), anxiety, HLD  Patient Care Team: Jerre Simon, MD as PCP - General (Family Medicine)   OBJECTIVE:   BP 134/88   Pulse 92   Ht 5\' 9"  (1.753 m)   Wt 235 lb 9.6 oz (106.9 kg)   SpO2 95%   BMI 34.79 kg/m   Physical Exam Constitutional:      General: He is not in acute distress. HENT:     Head: Normocephalic and atraumatic.  Cardiovascular:     Rate and Rhythm: Normal rate and regular rhythm.  Pulmonary:     Effort: Pulmonary effort is normal. No respiratory distress.     Breath sounds: Normal breath sounds.  Neurological:     Mental Status: He is alert.         03/06/2023    1:53 PM  Depression screen PHQ 2/9  Decreased Interest 1  Down, Depressed, Hopeless 1  PHQ - 2 Score 2  Altered sleeping 0  Tired, decreased energy 1  Change in appetite 0  Feeling bad or failure about yourself  1  Trouble concentrating 1  Moving slowly or fidgety/restless 0  Suicidal thoughts 0  PHQ-9 Score 5  Difficult doing work/chores Somewhat difficult     {Show previous vital signs (optional):23777}  Last hemoglobin A1c Lab Results  Component Value Date   HGBA1C 6.4 03/06/2023      ASSESSMENT/PLAN:   Problem List Items Addressed This Visit       Endocrine   Type 2 diabetes mellitus without complications (HCC) - Primary    Well-controlled on current regimen,  patient desires further weight loss so will increase Trulicity to 1.5 mg weekly.      Relevant Medications   Dulaglutide (TRULICITY) 1.5 MG/0.5ML SOPN   atorvastatin (LIPITOR) 20 MG tablet   Other Relevant Orders   HgB A1c (Completed)   Hyperlipidemia associated with type 2 diabetes mellitus (HCC)    Not on statin, will start atorvastatin.  Counseled patient on medication use.      Relevant Medications   Dulaglutide (TRULICITY) 1.5 MG/0.5ML SOPN   atorvastatin (LIPITOR) 20 MG tablet     Other   Routine screening for STI (sexually transmitted infection)   Relevant Orders   Urine cytology ancillary only   HIV antibody (with reflex)   RPR   Other Visit Diagnoses     History of chlamydia              Return in about 3 months (around 06/06/2023) for f/u diabetes.   Littie Deeds, MD [facility]

## 2023-03-06 NOTE — Patient Instructions (Addendum)
It was nice seeing you today!  Increase your Trulicity to 1.5 mg weekly.  You can finish your current prescription before starting.  Start taking atorvastatin once daily to lower cholesterol and to help prevent heart attacks and stroke.  Results will be available in the next few days.  Stay well, Littie Deeds, MD [facility] [phone]  --  Make sure to check out at the front desk before you leave today.  Please arrive at least 15 minutes prior to your scheduled appointments.  If you had blood work today, I will send you a MyChart message or a letter if results are normal. Otherwise, I will give you a call.  If you had a referral placed, they will call you to set up an appointment. Please give [place] a call if you don't hear back in the next 2 weeks.  If you need additional refills before your next appointment, please call your pharmacy first.

## 2023-03-06 NOTE — Assessment & Plan Note (Signed)
Not on statin, will start atorvastatin.  Counseled patient on medication use.

## 2023-03-06 NOTE — Assessment & Plan Note (Signed)
Well-controlled on current regimen, patient desires further weight loss so will increase Trulicity to 1.5 mg weekly.

## 2023-03-07 LAB — HIV ANTIBODY (ROUTINE TESTING W REFLEX): HIV Screen 4th Generation wRfx: NONREACTIVE

## 2023-03-07 LAB — RPR: RPR Ser Ql: NONREACTIVE

## 2023-03-09 LAB — URINE CYTOLOGY ANCILLARY ONLY
Chlamydia: NEGATIVE
Comment: NEGATIVE
Comment: NORMAL
Neisseria Gonorrhea: NEGATIVE

## 2023-05-26 ENCOUNTER — Ambulatory Visit: Payer: PRIVATE HEALTH INSURANCE | Admitting: Student

## 2023-06-22 ENCOUNTER — Ambulatory Visit: Payer: PRIVATE HEALTH INSURANCE | Admitting: Student

## 2023-07-02 ENCOUNTER — Other Ambulatory Visit (HOSPITAL_COMMUNITY)
Admission: RE | Admit: 2023-07-02 | Discharge: 2023-07-02 | Disposition: A | Discharge status: Home / Self Care | Payer: PRIVATE HEALTH INSURANCE | Source: Ambulatory Visit | Attending: Family Medicine | Admitting: Family Medicine

## 2023-07-02 ENCOUNTER — Ambulatory Visit (INDEPENDENT_AMBULATORY_CARE_PROVIDER_SITE_OTHER): Payer: PRIVATE HEALTH INSURANCE | Admitting: Student

## 2023-07-02 ENCOUNTER — Other Ambulatory Visit: Payer: Self-pay | Admitting: Student

## 2023-07-02 ENCOUNTER — Encounter: Payer: Self-pay | Admitting: Student

## 2023-07-02 ENCOUNTER — Telehealth: Payer: Self-pay | Admitting: Student

## 2023-07-02 VITALS — BP 123/87 | HR 86 | Ht 69.0 in | Wt 225.2 lb

## 2023-07-02 DIAGNOSIS — Z7985 Long-term (current) use of injectable non-insulin antidiabetic drugs: Secondary | ICD-10-CM | POA: Diagnosis not present

## 2023-07-02 DIAGNOSIS — E119 Type 2 diabetes mellitus without complications: Secondary | ICD-10-CM

## 2023-07-02 DIAGNOSIS — Z113 Encounter for screening for infections with a predominantly sexual mode of transmission: Secondary | ICD-10-CM | POA: Insufficient documentation

## 2023-07-02 LAB — POCT GLYCOSYLATED HEMOGLOBIN (HGB A1C): HbA1c, POC (controlled diabetic range): 6.2 % (ref 0.0–7.0)

## 2023-07-02 MED ORDER — TRULICITY 1.5 MG/0.5ML ~~LOC~~ SOAJ: 1.5000 mg | SUBCUTANEOUS | 2 refills | Status: DC

## 2023-07-02 NOTE — Patient Instructions (Addendum)
It was wonderful to see you today. Thank you for allowing me to be a part of your care. Below is a short summary of what we discussed at your visit today:  Will collected your A1c today  Please continue to work on your diet and exercise and we will improve your blood sugars.  Today we obtained lab to test you for your gonorrhea and chlamydia.  Will follow up with you on the results  If you have any questions or concerns, please do not hesitate to contact us via phone or MyChart message.   Jerre Simon, MD [facility]

## 2023-07-02 NOTE — Assessment & Plan Note (Signed)
Well-controlled diabetes with improving A1c of 6.2% today.  Patient endorses compliance and good tolerance to Trulicity. -Obtained A1c -Discussed effectiveness of dieting and exercising -Continue Trulicity as prescribed -Recommend completion of annual eye exam. -Follow-up in 3 months.

## 2023-07-02 NOTE — Progress Notes (Signed)
    SUBJECTIVE:   CHIEF COMPLAINT / HPI:   Patient is a 42 year old male with history of type 2 diabetes presenting for diabetes follow-up.  Last A1c 3 months ago was 6.4.  He is currently on weekly Trulicity and endorses good compliance and tolerance.  Does not check home sugars but is any incidence of hypoglycemia and hyperglycemia.  Annual diabetic eye exam scheduled for this month.  PERTINENT  PMH / PSH: Reviewed  OBJECTIVE:   BP 123/87   Pulse 86   Ht 5\' 9"  (1.753 m)   Wt 225 lb 3.2 oz (102.2 kg)   SpO2 98%   BMI 33.26 kg/m    Physical Exam General: Alert, well appearing, NAD Cardiovascular: RRR, No Murmurs, Normal S2/S2 Respiratory: CTAB, No wheezing or Rales Abdomen: No distension or tenderness Extremities: No edema on extremities   Skin: Warm and dry  ASSESSMENT/PLAN:   Type 2 diabetes mellitus without complications (HCC) Well-controlled diabetes with improving A1c of 6.2% today.  Patient endorses compliance and good tolerance to Trulicity. -Obtained A1c -Discussed effectiveness of dieting and exercising -Continue Trulicity as prescribed -Recommend completion of annual eye exam. -Follow-up in 3 months.    STD screening Sexually active male expressed desire for STD testing.  Currently asymptomatic and wants routine testing. -Ordered lab for GC/chlamydia, HIV and RPR.  Jerre Simon, MD [facility]

## 2023-07-02 NOTE — Telephone Encounter (Signed)
Patient requesting refill of:  Name of Medication(s):  Trulicity  Last date of OV:  07/02/23 Pharmacy:  same  Will route refill request to Clinic RN.  Discussed with patient policy to call pharmacy for future refills.  Also, discussed refills may take up to 48 hours to approve or deny.  Vilinda Blanks

## 2023-07-02 NOTE — Progress Notes (Signed)
Refilled patient's Trulicity

## 2023-07-03 ENCOUNTER — Encounter: Payer: Self-pay | Admitting: Student

## 2023-07-03 DIAGNOSIS — E119 Type 2 diabetes mellitus without complications: Secondary | ICD-10-CM

## 2023-07-03 LAB — LIPID PANEL
Chol/HDL Ratio: 3.1 {ratio} (ref 0.0–5.0)
Cholesterol, Total: 119 mg/dL (ref 100–199)
HDL: 39 mg/dL — ABNORMAL LOW (ref >39)
LDL Chol Calc (NIH): 63 mg/dL (ref 0–99)
Triglycerides: 85 mg/dL (ref 0–149)
VLDL Cholesterol Cal: 17 mg/dL (ref 5–40)

## 2023-07-03 LAB — RPR: RPR Ser Ql: NONREACTIVE

## 2023-07-03 LAB — HIV ANTIBODY (ROUTINE TESTING W REFLEX): HIV Screen 4th Generation wRfx: NONREACTIVE

## 2023-07-03 MED ORDER — TRULICITY 1.5 MG/0.5ML ~~LOC~~ SOAJ: 1.5000 mg | SUBCUTANEOUS | 2 refills | Status: DC

## 2023-07-06 LAB — URINE CYTOLOGY ANCILLARY ONLY
Chlamydia: NEGATIVE
Comment: NEGATIVE
Comment: NORMAL
Neisseria Gonorrhea: NEGATIVE

## 2023-09-23 ENCOUNTER — Other Ambulatory Visit: Payer: Self-pay | Admitting: Student

## 2023-09-23 DIAGNOSIS — E119 Type 2 diabetes mellitus without complications: Secondary | ICD-10-CM

## 2023-11-23 ENCOUNTER — Encounter: Payer: Self-pay | Admitting: Student

## 2023-11-23 ENCOUNTER — Ambulatory Visit
Admission: RE | Admit: 2023-11-23 | Discharge: 2023-11-23 | Disposition: A | Discharge status: Home / Self Care | Payer: PRIVATE HEALTH INSURANCE | Source: Ambulatory Visit | Attending: Internal Medicine

## 2023-11-23 ENCOUNTER — Encounter: Payer: PRIVATE HEALTH INSURANCE | Admitting: Student

## 2023-11-23 VITALS — BP 149/91 | HR 106 | Temp 98.8°F | Resp 17

## 2023-11-23 DIAGNOSIS — Z9189 Other specified personal risk factors, not elsewhere classified: Secondary | ICD-10-CM | POA: Diagnosis not present

## 2023-11-23 DIAGNOSIS — Z113 Encounter for screening for infections with a predominantly sexual mode of transmission: Secondary | ICD-10-CM | POA: Diagnosis present

## 2023-11-23 DIAGNOSIS — Z7985 Long-term (current) use of injectable non-insulin antidiabetic drugs: Secondary | ICD-10-CM | POA: Diagnosis not present

## 2023-11-23 DIAGNOSIS — Z7984 Long term (current) use of oral hypoglycemic drugs: Secondary | ICD-10-CM | POA: Insufficient documentation

## 2023-11-23 DIAGNOSIS — E119 Type 2 diabetes mellitus without complications: Secondary | ICD-10-CM | POA: Insufficient documentation

## 2023-11-23 NOTE — ED Provider Notes (Addendum)
 [facility]    CSN: [id number] Arrival date & time: 11/23/23  1605      History   Chief Complaint Chief Complaint  Patient presents with   SEXUALLY TRANSMITTED DISEASE    Testing for all. - Entered by patient    HPI Jesse Shelton is a 43 y.o. male.   Jesse Shelton is a 43 y.o. male presenting to urgent care requesting STI testing.  Currently asymptomatic and without recent known STI exposure.  Sexually active with male partner(s) without protection. Denies urinary symptoms, N/V/D, pelvic/abdominal pain, new low back pain, fever/chills.  No penile discharge, odor, or itch.       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   Vitamin D deficiency 11/17/2020   Hyperlipidemia associated with type 2 diabetes mellitus (HCC) 11/18/2019   Tobacco use 11/18/2019   Type 2 diabetes mellitus without complications (HCC) 05/12/2019   GAD (generalized anxiety disorder) 11/10/2018   High risk heterosexual behavior 11/10/2017   OSA (obstructive sleep apnea) 02/14/2015   Genital herpes 10/18/2014   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 Authorizing Provider  amphetamine-dextroamphetamine (ADDERALL XR) 25 MG 24 hr  capsule Take by mouth every morning.    [provider]  atorvastatin (LIPITOR) 20 MG tablet Take 1 tablet (20 mg total) by mouth daily. 03/06/23   Littie Deeds, MD  buPROPion (WELLBUTRIN XL) 300 MG 24 hr tablet Take 1 tablet (300 mg total) by mouth daily. 11/19/22   Jerre Simon, MD  mirtazapine (REMERON) 45 MG tablet Take 1 tablet (45 mg total) by mouth at bedtime. 11/19/22 02/17/23  Jerre Simon, MD  sildenafil (VIAGRA) 100 MG tablet TAKE 1 TABLET 1 HOUR BEFORE INTERCOURSE DAILY AS NEEDED 11/19/22   Jerre Simon, MD  TRULICITY 1.5 MG/0.5ML SOAJ Inject 1.5 mg into the skin once a week. 09/24/23   Jerre Simon, MD  valACYclovir (VALTREX) 1000 MG tablet Take 1 tablet (1,000 mg total) by mouth daily for 5 days within 1 day of lesion as needed. 11/19/22   Jerre Simon, MD  metFORMIN (GLUCOPHAGE XR) 750 MG 24 hr tablet Take 2 tablets (1,500 mg total) by mouth daily with breakfast. 02/25/19 05/12/19  Durward Parcel, DO    Family History Family History  Problem Relation Age of Onset   Cancer Maternal Grandmother        pancreatic cancer   Depression Maternal Grandmother    Diabetes Maternal Grandmother    Depression Maternal Aunt    Diabetes Maternal Aunt     Social History Social History   Tobacco Use   Smoking status: Every Day    Current packs/day: 0.10  Average packs/day: 0.1 packs/day for 5.0 years (0.5 ttl pk-yrs)    Types: E-cigarettes, Cigarettes   Smokeless tobacco: Never   Tobacco comments:     vapes  Vaping Use   Vaping status: Every Day  Substance Use Topics   Alcohol use: Yes    Alcohol/week: 2.0 standard drinks of alcohol    Types: 1 Cans of beer, 1 Shots of liquor per week   Drug use: No     Allergies   Patient has no known allergies.   Review of Systems Review of Systems   Physical Exam Triage Vital Signs ED Triage Vitals  Encounter Vitals Group     BP 11/23/23 1634 (!) 149/91     Systolic BP Percentile --      Diastolic BP Percentile --       Pulse Rate 11/23/23 1634 (!) 106     Resp 11/23/23 1634 17     Temp 11/23/23 1634 98.8 F (37.1 C)     Temp Source 11/23/23 1634 Oral     SpO2 11/23/23 1634 98 %     Weight --      Height --      Head Circumference --      Peak Flow --      Pain Score 11/23/23 1639 0     Pain Loc --      Pain Education --      Exclude from Growth Chart --    No data found.  Updated Vital Signs BP (!) 149/91 (BP Location: Right Arm)   Pulse (!) 106   Temp 98.8 F (37.1 C) (Oral)   Resp 17   SpO2 98%   Visual Acuity Right Eye Distance:   Left Eye Distance:   Bilateral Distance:    Right Eye Near:   Left Eye Near:    Bilateral Near:     Physical Exam Vitals and nursing note reviewed.  Constitutional:      Appearance: He is not ill-appearing or toxic-appearing.  HENT:     Head: Normocephalic and atraumatic.     Right Ear: Hearing and external ear normal.     Left Ear: Hearing and external ear normal.     Nose: Nose normal.     Mouth/Throat:     Lips: Pink.  Eyes:     General: Lids are normal. Vision grossly intact. Gaze aligned appropriately.     Extraocular Movements: Extraocular movements intact.     Conjunctiva/sclera: Conjunctivae normal.  Pulmonary:     Effort: Pulmonary effort is normal.  Musculoskeletal:     Cervical back: Neck supple.  Skin:    General: Skin is warm and dry.     Capillary Refill: Capillary refill takes less than 2 seconds.     Findings: No rash.  Neurological:     General: No focal deficit present.     Mental Status: He is alert and oriented to person, place, and time. Mental status is at baseline.     Cranial Nerves: No dysarthria or facial asymmetry.  Psychiatric:        Mood and Affect: Mood normal.        Speech: Speech normal.        Behavior: Behavior normal.        Thought Content: Thought content normal.        Judgment: Judgment normal.      UC Treatments / Results  Labs (all labs ordered are listed, but only abnormal results are  displayed)  Labs Reviewed  HIV ANTIBODY (ROUTINE TESTING W REFLEX)  RPR  CYTOLOGY, (ORAL, ANAL, URETHRAL) ANCILLARY ONLY    EKG   Radiology No results found.  Procedures Procedures (including critical care time)  Medications Ordered in UC Medications - No data to display  Initial Impression / Assessment and Plan / UC Course  I have reviewed the triage vital signs and the nursing notes.  Pertinent labs & imaging results that were available during my care of the patient were reviewed by me and considered in my medical decision making (see chart for details).   1. Screening for STD STI labs pending, will notify patient of positive results and treat accordingly per protocol when labs result.  Patient agrees to HIV and syphilis testing today.   Patient to avoid sexual intercourse until screening testing comes back.   Education provided regarding safe sexual practices and patient encouraged to use protection to prevent spread of STIs.    He is requesting HA1C testing today as well. He missed his PCP appointment this morning where this was supposed to be performed.  No symptoms of hyper or hypoglycemia. Hx of DM2.  Advised PCP follow-up for this.  Counseled patient on potential for adverse effects with medications prescribed/recommended today, strict ER and return-to-clinic precautions discussed, patient verbalized understanding.    Final Clinical Impressions(s) / UC Diagnoses   Final diagnoses:  Screening for STD (sexually transmitted disease)  At risk for sexually transmitted disease due to unprotected sex     Discharge Instructions      STD testing pending, this will take 2-3 days to result. We will only call you if your testing is positive for any infection(s) and we will provide treatment.  Avoid sexual intercourse until your STD results come back.  If any of your STD results are positive, you will need to avoid sexual intercourse for 7 days while you are being treated  to prevent spread of STD.  Condom use is the best way to prevent spread of STDs. Notify partner(s) of any positive results.  Return to urgent care as needed.      ED Prescriptions   None    PDMP not reviewed this encounter.   Carlisle Beers, FNP 11/23/23 1707    Carlisle Beers, FNP 11/23/23 1712

## 2023-11-23 NOTE — Discharge Instructions (Signed)

## 2023-11-23 NOTE — ED Triage Notes (Signed)
 Pt presents for STD testing. Requests blood work.  States he does not have symptoms and this is yearly check up. He is also asking about blood work for A1c

## 2023-11-24 LAB — CYTOLOGY, (ORAL, ANAL, URETHRAL) ANCILLARY ONLY
Chlamydia: NEGATIVE
Comment: NEGATIVE
Comment: NEGATIVE
Comment: NORMAL
Neisseria Gonorrhea: NEGATIVE
Trichomonas: NEGATIVE

## 2023-11-24 LAB — HIV ANTIBODY (ROUTINE TESTING W REFLEX): HIV Screen 4th Generation wRfx: NONREACTIVE

## 2023-11-24 LAB — RPR: RPR Ser Ql: NONREACTIVE

## 2023-11-27 ENCOUNTER — Telehealth: Payer: Self-pay

## 2023-11-27 NOTE — Telephone Encounter (Signed)
 Pharmacy Patient Advocate Encounter   Received notification from CoverMyMeds that prior authorization for TRULICITY is required/requested.   The patient is insured through [employer] .   PA required; PA submitted to above mentioned insurance via CoverMyMeds Key/confirmation #/EOC [id number]. Status is pending

## 2023-11-27 NOTE — Telephone Encounter (Signed)
 Pharmacy Patient Advocate Encounter  Received notification from [facility] that Prior Authorization for TRULICITY has been APPROVED from 11/27/23 to 11/26/24   PA #/Case ID/Reference #: [id number]

## 2024-01-07 ENCOUNTER — Encounter: Payer: Self-pay | Admitting: Student

## 2024-01-07 ENCOUNTER — Ambulatory Visit (INDEPENDENT_AMBULATORY_CARE_PROVIDER_SITE_OTHER): Payer: PRIVATE HEALTH INSURANCE | Admitting: Student

## 2024-01-07 VITALS — BP 104/70 | HR 82 | Ht 69.0 in | Wt 214.0 lb

## 2024-01-07 DIAGNOSIS — G4733 Obstructive sleep apnea (adult) (pediatric): Secondary | ICD-10-CM | POA: Diagnosis not present

## 2024-01-07 DIAGNOSIS — E1169 Type 2 diabetes mellitus with other specified complication: Secondary | ICD-10-CM

## 2024-01-07 DIAGNOSIS — Z720 Tobacco use: Secondary | ICD-10-CM

## 2024-01-07 DIAGNOSIS — B009 Herpesviral infection, unspecified: Secondary | ICD-10-CM

## 2024-01-07 DIAGNOSIS — F330 Major depressive disorder, recurrent, mild: Secondary | ICD-10-CM

## 2024-01-07 DIAGNOSIS — E119 Type 2 diabetes mellitus without complications: Secondary | ICD-10-CM | POA: Diagnosis not present

## 2024-01-07 DIAGNOSIS — E785 Hyperlipidemia, unspecified: Secondary | ICD-10-CM

## 2024-01-07 DIAGNOSIS — Z7985 Long-term (current) use of injectable non-insulin antidiabetic drugs: Secondary | ICD-10-CM

## 2024-01-07 LAB — POCT GLYCOSYLATED HEMOGLOBIN (HGB A1C): HbA1c, POC (controlled diabetic range): 5.9 % (ref 0.0–7.0)

## 2024-01-07 MED ORDER — ATORVASTATIN CALCIUM 20 MG PO TABS: 20.0000 mg | ORAL_TABLET | Freq: Every day | ORAL | 3 refills | Status: AC

## 2024-01-07 MED ORDER — VALACYCLOVIR HCL 1 G PO TABS: 1000.0000 mg | ORAL_TABLET | Freq: Every day | ORAL | 3 refills | Status: AC

## 2024-01-07 MED ORDER — BUPROPION HCL ER (XL) 300 MG PO TB24: 300.0000 mg | ORAL_TABLET | Freq: Every day | ORAL | 3 refills | Status: AC

## 2024-01-07 MED ORDER — TRULICITY 1.5 MG/0.5ML ~~LOC~~ SOAJ: 1.5000 mg | SUBCUTANEOUS | 2 refills | Status: DC

## 2024-01-07 NOTE — Assessment & Plan Note (Signed)
 Still smokes.  Provided counseling on smoking cessation.

## 2024-01-07 NOTE — Progress Notes (Signed)
    SUBJECTIVE:   CHIEF COMPLAINT / HPI:   - 43 year old male with history of OSA, type 2 diabetes and hyperlipidemia - Presenting today for annual physical exam - No medical concerns today - No issues with sleep, sleeps through the night and gets about 7-8 hours - No exercise but his work involves a lot of walking - Eats regular diet with good appetite - Smokes 1 black commend for 2 days since 2002 -Drinks about 3 times a week. -Works as a Water quality scientist - Lives at home with his 2 kids.  Type 2 diabetes History of type 2 diabetes Currently on Trulicity 1.5 mg weekly Last A1c 6 months ago 6.2% No hypoglycemia/hypoglycemic episodes   PERTINENT  PMH / PSH: Reviewed   OBJECTIVE:   BP 104/70   Pulse 82   Ht 5\' 9"  (1.753 m)   Wt 214 lb (97.1 kg)   SpO2 98%   BMI 31.60 kg/m     Physical Exam General: Alert, well appearing, NAD Cardiovascular: RRR, No Murmurs, Normal S2/S2 Respiratory: CTAB, No wheezing or Rales Abdomen: No distension or tenderness Extremities: No edema on extremities   Skin: Warm and dry Neuro: ANO x 3, no focal neurodeficits  ASSESSMENT/PLAN:   Type 2 diabetes mellitus without complications (HCC) Well Controlled and A1c 5.9% today.  No hyperglycemic or hypoglycemic episodes.  Currently on Trulicity and have lost about 40 pounds since  2022.  Up-to-date on his annual diabetes eye exam, patient to send results via MyChart. -Refilled Trulicity - Obtained A1c, BMP, lipid panel - Discussed exercise and diet.  Tobacco use Still smokes.  Provided counseling on smoking cessation.  OSA (obstructive sleep apnea) Chronic and stable.  Still using his nightly CPAP.    Jerre Simon, MD [facility]

## 2024-01-07 NOTE — Assessment & Plan Note (Addendum)
 Well Controlled and A1c 5.9% today.  No hyperglycemic or hypoglycemic episodes.  Currently on Trulicity and have lost about 40 pounds since  2022.  Up-to-dated on his annual diabetes eye exam, patient to send results via MyChart. -Refilled Trulicity - Obtained A1c, BMP, lipid panel - Discussed exercise and diet.

## 2024-01-07 NOTE — Patient Instructions (Addendum)
 Pleasure to meet you today.  Your A1c today was 5.9%  Please continue to work on your diet and exercise/increased activity.  Recommended exercise is moderate intensity at least 150 hours in a week.  Today we ordered lab to check your A1c, electrolytes, kidney function and cholesterol levels.

## 2024-01-07 NOTE — Assessment & Plan Note (Signed)
 Chronic and stable.  Still using his nightly CPAP.

## 2024-01-08 LAB — CBC
Hematocrit: 50.2 % (ref 37.5–51.0)
Hemoglobin: 16.9 g/dL (ref 13.0–17.7)
MCH: 32.2 pg (ref 26.6–33.0)
MCHC: 33.7 g/dL (ref 31.5–35.7)
MCV: 96 fL (ref 79–97)
Platelets: 395 10*3/uL (ref 150–450)
RBC: 5.25 x10E6/uL (ref 4.14–5.80)
RDW: 13.3 % (ref 11.6–15.4)
WBC: 6.1 10*3/uL (ref 3.4–10.8)

## 2024-01-08 LAB — BASIC METABOLIC PANEL WITH GFR
BUN/Creatinine Ratio: 8 — ABNORMAL LOW (ref 9–20)
BUN: 11 mg/dL (ref 6–24)
CO2: 22 mmol/L (ref 20–29)
Calcium: 9.9 mg/dL (ref 8.7–10.2)
Chloride: 105 mmol/L (ref 96–106)
Creatinine, Ser: 1.33 mg/dL — ABNORMAL HIGH (ref 0.76–1.27)
Glucose: 98 mg/dL (ref 70–99)
Potassium: 5 mmol/L (ref 3.5–5.2)
Sodium: 143 mmol/L (ref 134–144)
eGFR: 69 mL/min/{1.73_m2} (ref >59)

## 2024-01-08 LAB — MICROALBUMIN / CREATININE URINE RATIO
Creatinine, Urine: 240 mg/dL
Microalb/Creat Ratio: 2 mg/g{creat} (ref 0–29)
Microalbumin, Urine: 4.6 ug/mL

## 2024-01-08 LAB — LIPID PANEL
Chol/HDL Ratio: 4 ratio (ref 0.0–5.0)
Cholesterol, Total: 156 mg/dL (ref 100–199)
HDL: 39 mg/dL — ABNORMAL LOW (ref >39)
LDL Chol Calc (NIH): 100 mg/dL — ABNORMAL HIGH (ref 0–99)
Triglycerides: 88 mg/dL (ref 0–149)
VLDL Cholesterol Cal: 17 mg/dL (ref 5–40)

## 2024-03-24 ENCOUNTER — Ambulatory Visit (INDEPENDENT_AMBULATORY_CARE_PROVIDER_SITE_OTHER): Payer: PRIVATE HEALTH INSURANCE | Admitting: Student

## 2024-03-24 ENCOUNTER — Other Ambulatory Visit (HOSPITAL_COMMUNITY)
Admission: RE | Admit: 2024-03-24 | Discharge: 2024-03-24 | Disposition: A | Discharge status: Home / Self Care | Payer: PRIVATE HEALTH INSURANCE | Source: Ambulatory Visit | Attending: Family Medicine | Admitting: Family Medicine

## 2024-03-24 VITALS — BP 131/93 | HR 85 | Ht 69.0 in | Wt 214.4 lb

## 2024-03-24 DIAGNOSIS — E119 Type 2 diabetes mellitus without complications: Secondary | ICD-10-CM

## 2024-03-24 DIAGNOSIS — Z113 Encounter for screening for infections with a predominantly sexual mode of transmission: Secondary | ICD-10-CM | POA: Insufficient documentation

## 2024-03-24 DIAGNOSIS — N529 Male erectile dysfunction, unspecified: Secondary | ICD-10-CM | POA: Diagnosis not present

## 2024-03-24 LAB — POCT GLYCOSYLATED HEMOGLOBIN (HGB A1C): HbA1c, POC (prediabetic range): 6 % (ref 5.7–6.4)

## 2024-03-24 MED ORDER — SILDENAFIL CITRATE 100 MG PO TABS: ORAL_TABLET | ORAL | 2 refills | Status: AC

## 2024-03-24 MED ORDER — TRULICITY 3 MG/0.5ML ~~LOC~~ SOAJ: 3.0000 mg | SUBCUTANEOUS | 0 refills | Status: DC

## 2024-03-24 NOTE — Assessment & Plan Note (Signed)
 A1c is 6.0, well-controlled-patient would like to increase Trulicity  dosage. -Increase Trulicity  to 3.0 mg weekly

## 2024-03-24 NOTE — Patient Instructions (Signed)
  VISIT SUMMARY: Today, you came in for a routine checkup and requested comprehensive STD testing. We discussed your current medications and refilled those that were necessary. We also reviewed your diabetes management and erectile dysfunction treatment.  YOUR PLAN: -SEXUALLY TRANSMITTED INFECTION SCREENING: We will conduct routine blood tests for HIV, hepatitis C, and syphilis to ensure your sexual health is monitored and maintained.  -TYPE 2 DIABETES MELLITUS: Your A1c level is 6.0, which indicates good control of your diabetes. I have  increased your Trulicity  dosage to help maintain this control.  -ERECTILE DYSFUNCTION: We have refilled your prescription for Viagra  to help manage your erectile dysfunction.  INSTRUCTIONS: Please follow up with the lab for your blood tests for HIV, hepatitis C, and syphilis, Gonnorhea, chlamydia                     Contains text generated by Abridge.                                 Contains text generated by Abridge.

## 2024-03-24 NOTE — Progress Notes (Signed)
    SUBJECTIVE:   CHIEF COMPLAINT / HPI: Routine testing  Discussed the use of AI scribe software for clinical note transcription with the patient, who gave verbal consent to proceed.  History of Present Illness [facility] is a 43 year old male who presents for routine checkups and STD testing.  He requests comprehensive STD testing, including blood tests for HIV, hepatitis C, and syphilis. His last hepatitis C screen was in 2022, and HIV and RPR tests were last conducted in February 2025. He has no symptoms such as dysuria or discharge and has not had any new sexual partners.  Current medications include Valtrex  as needed, Remeron  at night, Trulicity  1.5 mg weekly, Wellbutrin  300 mg daily, atorvastatin  20 mg daily, and Adderall. He requires a refill for Viagra .   PERTINENT  PMH / PSH: OSA, T2DM, HLD, genital herpes, ED  OBJECTIVE:   BP (!) 133/94   Pulse 85   Ht 5' 9 (1.753 m)   Wt 214 lb 6.4 oz (97.3 kg)   SpO2 100%   BMI 31.66 kg/m   General: Well appearing, NAD, awake, alert, responsive to questions Head: Normocephalic atraumatic CV: Regular rate and rhythm no murmurs rubs or gallops Respiratory: Clear to ausculation bilaterally, no wheezes rales or crackles, chest rises symmetrically,  no increased work of breathing Extremities: Moves upper and lower extremities freely, no edema in LE  ASSESSMENT/PLAN:   Assessment & Plan Routine screening for STI (sexually transmitted infection) Requests testing, asymptomatic - Conduct blood tests for HIV, hepatitis C, and syphilis.  - Urine test for G/C Type 2 diabetes mellitus without complication, without long-term current use of insulin (HCC) A1c is 6.0, well-controlled-patient would like to increase Trulicity  dosage. -Increase Trulicity  to 3.0 mg weekly   Wendel Lesch, MD [facility]

## 2024-03-24 NOTE — Addendum Note (Signed)
 Addended by: Ashleigh Luckow L on: 03/24/2024 09:49 AM   Modules accepted: Orders

## 2024-03-24 NOTE — Assessment & Plan Note (Signed)
 Requests testing, asymptomatic - Conduct blood tests for HIV, hepatitis C, and syphilis.  - Urine test for G/C

## 2024-03-25 ENCOUNTER — Ambulatory Visit: Payer: Self-pay | Admitting: Student

## 2024-03-25 LAB — URINE CYTOLOGY ANCILLARY ONLY
Chlamydia: NEGATIVE
Comment: NEGATIVE
Comment: NEGATIVE
Comment: NORMAL
Neisseria Gonorrhea: NEGATIVE
Trichomonas: NEGATIVE

## 2024-03-25 LAB — HIV ANTIBODY (ROUTINE TESTING W REFLEX): HIV Screen 4th Generation wRfx: NONREACTIVE

## 2024-03-25 LAB — HEPATITIS C ANTIBODY: Hep C Virus Ab: NONREACTIVE

## 2024-03-25 LAB — RPR: RPR Ser Ql: NONREACTIVE

## 2024-04-20 ENCOUNTER — Other Ambulatory Visit: Payer: Self-pay | Admitting: Student

## 2024-04-20 DIAGNOSIS — E119 Type 2 diabetes mellitus without complications: Secondary | ICD-10-CM

## 2024-07-22 ENCOUNTER — Other Ambulatory Visit: Payer: Self-pay | Admitting: Student

## 2024-07-22 DIAGNOSIS — E119 Type 2 diabetes mellitus without complications: Secondary | ICD-10-CM

## 2024-08-31 ENCOUNTER — Encounter: Payer: Self-pay | Admitting: Family Medicine

## 2024-08-31 DIAGNOSIS — Z113 Encounter for screening for infections with a predominantly sexual mode of transmission: Secondary | ICD-10-CM

## 2024-08-31 DIAGNOSIS — E119 Type 2 diabetes mellitus without complications: Secondary | ICD-10-CM

## 2024-09-05 NOTE — Telephone Encounter (Signed)
 All orders faxed.

## 2024-11-18 ENCOUNTER — Encounter: Payer: PRIVATE HEALTH INSURANCE | Admitting: Family Medicine
